# Patient Record
Sex: Male | Born: 1990 | Race: Black or African American | Hispanic: No | Marital: Single | Smoking: Never smoker
Health system: Southern US, Community
[De-identification: ages and names within clinical notes are randomized; demographics above are authoritative.]

## PROBLEM LIST (undated history)

## (undated) DIAGNOSIS — T7840XA Allergy, unspecified, initial encounter: Secondary | ICD-10-CM

## (undated) HISTORY — PX: HERNIA REPAIR: SHX51

## (undated) HISTORY — DX: Allergy, unspecified, initial encounter: T78.40XA

---
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
## 2011-02-22 ENCOUNTER — Emergency Department (HOSPITAL_COMMUNITY)
Admission: EM | Admit: 2011-02-22 | Discharge: 2011-02-22 | Disposition: A | Discharge status: Home / Self Care | Payer: BC Managed Care – PPO | Attending: Emergency Medicine | Admitting: Emergency Medicine

## 2011-02-22 ENCOUNTER — Emergency Department (HOSPITAL_COMMUNITY): Payer: BC Managed Care – PPO

## 2011-02-22 DIAGNOSIS — S301XXA Contusion of abdominal wall, initial encounter: Secondary | ICD-10-CM | POA: Insufficient documentation

## 2011-02-22 DIAGNOSIS — Y9361 Activity, american tackle football: Secondary | ICD-10-CM | POA: Insufficient documentation

## 2011-02-22 DIAGNOSIS — Y9239 Other specified sports and athletic area as the place of occurrence of the external cause: Secondary | ICD-10-CM | POA: Insufficient documentation

## 2011-02-22 DIAGNOSIS — R079 Chest pain, unspecified: Secondary | ICD-10-CM | POA: Insufficient documentation

## 2011-02-22 DIAGNOSIS — R109 Unspecified abdominal pain: Secondary | ICD-10-CM | POA: Insufficient documentation

## 2011-02-22 DIAGNOSIS — IMO0002 Reserved for concepts with insufficient information to code with codable children: Secondary | ICD-10-CM | POA: Insufficient documentation

## 2011-02-22 DIAGNOSIS — M549 Dorsalgia, unspecified: Secondary | ICD-10-CM | POA: Insufficient documentation

## 2011-02-22 DIAGNOSIS — W1801XA Striking against sports equipment with subsequent fall, initial encounter: Secondary | ICD-10-CM | POA: Insufficient documentation

## 2011-02-22 LAB — URINALYSIS, ROUTINE W REFLEX MICROSCOPIC
Bilirubin Urine: NEGATIVE
Glucose, UA: NEGATIVE mg/dL
Hgb urine dipstick: NEGATIVE
Protein, ur: NEGATIVE mg/dL
Urobilinogen, UA: 0.2 mg/dL (ref 0.0–1.0)

## 2011-02-22 MED ORDER — IOHEXOL 300 MG/ML  SOLN
100.0000 mL | Freq: Once | INTRAMUSCULAR | Status: AC | PRN
  Administered 2011-02-22: 100 mL via INTRAVENOUS

## 2011-03-25 LAB — CBC
HCT: 41.2
Hemoglobin: 14
MCV: 89.9
Platelets: 336 — ABNORMAL HIGH
WBC: 21.4 — ABNORMAL HIGH

## 2011-03-25 LAB — DIFFERENTIAL
Eosinophils Absolute: 0.1
Eosinophils Relative: 0
Lymphocytes Relative: 4 — ABNORMAL LOW
Lymphs Abs: 0.9 — ABNORMAL LOW
Monocytes Absolute: 1.8 — ABNORMAL HIGH

## 2011-03-25 LAB — MONONUCLEOSIS SCREEN: Mono Screen: POSITIVE — AB

## 2014-01-21 ENCOUNTER — Emergency Department (HOSPITAL_COMMUNITY)
Admission: EM | Admit: 2014-01-21 | Discharge: 2014-01-21 | Disposition: A | Discharge status: Home / Self Care | Payer: BC Managed Care – PPO | Attending: Emergency Medicine | Admitting: Emergency Medicine

## 2014-01-21 ENCOUNTER — Encounter (HOSPITAL_COMMUNITY): Payer: Self-pay | Admitting: Emergency Medicine

## 2014-01-21 ENCOUNTER — Ambulatory Visit: Payer: BC Managed Care – PPO

## 2014-01-21 DIAGNOSIS — H109 Unspecified conjunctivitis: Secondary | ICD-10-CM | POA: Diagnosis not present

## 2014-01-21 DIAGNOSIS — H5789 Other specified disorders of eye and adnexa: Secondary | ICD-10-CM | POA: Diagnosis present

## 2014-01-21 MED ORDER — FLUORESCEIN SODIUM 1 MG OP STRP
1.0000 | ORAL_STRIP | Freq: Once | OPHTHALMIC | Status: AC
  Administered 2014-01-21: 1 via OPHTHALMIC
  Filled 2014-01-21: qty 1

## 2014-01-21 MED ORDER — TETRACAINE HCL 0.5 % OP SOLN
1.0000 [drp] | Freq: Once | OPHTHALMIC | Status: AC
  Administered 2014-01-21: 1 [drp] via OPHTHALMIC
  Filled 2014-01-21: qty 2

## 2014-01-21 MED ORDER — POLYMYXIN B-TRIMETHOPRIM 10000-0.1 UNIT/ML-% OP SOLN: 1.0000 [drp] | OPHTHALMIC | Status: DC

## 2014-01-21 NOTE — ED Provider Notes (Signed)
Medical screening examination/treatment/procedure(s) were performed by non-physician practitioner and as supervising physician I was immediately available for consultation/collaboration.   EKG Interpretation None       Caelan Atchley, MD 01/21/14 2213 

## 2014-01-21 NOTE — ED Notes (Signed)
He c/o red, watery left eye since yesterday.  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  He is not sure if and when he may've gotten something in his eye.  He is in no distress.

## 2014-01-21 NOTE — Discharge Instructions (Signed)
Use polytrim drops as directed until symptoms resolve. Refer to attached documents for more information.  °

## 2014-01-21 NOTE — ED Provider Notes (Signed)
CSN: [id number]     Arrival date & time 01/21/14  1459 History  This chart was scribed for non-physician practitioner, Emilia Beck, PA-C, working with Doug Sou, MD, by Bronson Curb, ED Scribe. This patient was seen in room WTR6/WTR6 and the patient's care was started at 4:00 PM.    Chief Complaint  Patient presents with  . Eye Problem    Patient is a 23 y.o. male presenting with eye problem. The history is provided by the patient. No language interpreter was used.  Eye Problem Location:  L eye Duration:  1 day Timing:  Constant Progression:  Unchanged Chronicity:  New Context: not burn, not contact lens problem, not direct trauma, not using machinery and not smoke exposure   Relieved by:  None tried Worsened by:  Nothing tried Ineffective treatments:  None tried Associated symptoms: crusting, discharge and redness     HPI Comments: Michael Mark. is a 23 y.o. male who presents to the Emergency Department for a left eye problem that began yesterday. Patient suspects he may have gotten something in his eye, but is uncertain. There is associated tearing, redness, and crusting. Patient denies foreign body sensation at present. 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 Patient has no history of significant health conditions.   History reviewed. No pertinent past medical history. No past surgical history on file. No family history on file. History  Substance Use Topics  . Smoking status: Never Smoker   . Smokeless tobacco: Not on file  . Alcohol Use: No    Review of Systems  Eyes: Positive for discharge and redness.  All other systems reviewed and are negative.     Allergies  Review of patient's allergies indicates no known allergies.  Home Medications   Prior to Admission medications   Not on File   Triage Vitals: BP 141/73  Pulse 74   Temp(Src) 98.6 F (37 C) (Oral)  Resp 16  SpO2 100%  Physical Exam  Nursing note and vitals reviewed. Constitutional: He is oriented to person, place, and time. He appears well-developed and well-nourished. No distress.  HENT:  Head: Normocephalic and atraumatic.  Eyes: EOM are normal. Left eye exhibits discharge. Left conjunctiva is injected.  Left eye conjunctival injection. No abrasions noted with fluroescein dye exam. Mild watery drainage noted.  Neck: Neck supple. No tracheal deviation present.  Cardiovascular: Normal rate.   Pulmonary/Chest: Effort normal. No respiratory distress.  Musculoskeletal: Normal range of motion.  Neurological: He is alert and oriented to person, place, and time.  Skin: Skin is warm and dry.  Psychiatric: He has a normal mood and affect. His behavior is normal.    ED Course  Procedures (including critical care time)  DIAGNOSTIC STUDIES: Oxygen Saturation is 100% on room air, normal by my interpretation.    COORDINATION OF CARE: At 1610 Discussed treatment plan with patient which includes Polytrim. Patient agrees.   Labs Review Labs Reviewed - No data to display  Imaging Review No results found.   EKG Interpretation None      MDM   Final diagnoses:  Conjunctivitis of left eye    4:26 PM Patient has no corneal abrasion noted on fluorescin dye exam. Patient likely has conjunctivitis. Patient will be treated with polytrim drops.   I personally performed the services described in this documentation, which was scribed in my  presence. The recorded information has been reviewed and is accurate.    Emilia BeckKaitlyn Gage Weant, PA-C 01/21/14 1627

## 2014-02-15 ENCOUNTER — Ambulatory Visit (INDEPENDENT_AMBULATORY_CARE_PROVIDER_SITE_OTHER): Payer: BC Managed Care – PPO | Admitting: Family Medicine

## 2014-02-15 VITALS — BP 124/82 | HR 78 | Temp 98.0°F | Resp 18 | Ht 70.0 in | Wt 192.8 lb

## 2014-02-15 DIAGNOSIS — Z111 Encounter for screening for respiratory tuberculosis: Secondary | ICD-10-CM

## 2014-02-15 NOTE — Addendum Note (Signed)
Addended by: Sydell Axon on: 02/15/2014 02:06 PM   Modules accepted: Orders

## 2014-02-15 NOTE — Progress Notes (Signed)
  Tuberculosis Risk Questionnaire  1. No Were you born outside the [place] in one of the following parts of the world: [place], [place], [place], [place] or [place]?    2. No Have you traveled outside the [place] and lived for more than one month in one of the following parts of the world: [place], [place], [place], [place] or [place]?    3. No Do you have a compromised immune system such as from any of the following conditions:HIV/AIDS, organ or bone marrow transplantation, diabetes, immunosuppressive medicines (e.g. Prednisone, Remicaide), leukemia, lymphoma, cancer of the head or neck, gastrectomy or jejunal bypass, end-stage renal disease (on dialysis), or silicosis?     4. No Have you ever or do you plan on working in: a residential care center, a health care facility, a jail or prison or homeless shelter?    5. No Have you ever: injected illegal drugs, used crack cocaine, lived in a homeless shelter  or been in jail or prison?     6. No Have you ever been exposed to anyone with infectious tuberculosis?    Tuberculosis Symptom Questionnaire  Do you currently have any of the following symptoms?  1. No Unexplained cough lasting more than 3 weeks?   2. No Unexplained fever lasting more than 3 weeks.   3. No Night Sweats (sweating that leaves the bedclothes and sheets wet)     4. No Shortness of Breath   5. No Chest Pain   6. No Unintentional weight loss    7. 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 He has no ongoing problems.  Objective: HEENT: Unremarkable Chest: Clear Heart: Regular no murmur: Abdomen: Soft nontender without HSM Extremities: Full range of motion  Assessment: Healthy individual.  Plan: TB test today with followup in 48 hours to check the site.  Signed, Elvina Sidle

## 2014-02-17 ENCOUNTER — Ambulatory Visit (INDEPENDENT_AMBULATORY_CARE_PROVIDER_SITE_OTHER): Payer: BC Managed Care – PPO | Admitting: Family Medicine

## 2014-02-17 VITALS — BP 118/80 | HR 71 | Temp 97.9°F | Resp 16 | Ht 69.0 in | Wt 187.8 lb

## 2014-02-17 DIAGNOSIS — Z0000 Encounter for general adult medical examination without abnormal findings: Secondary | ICD-10-CM

## 2014-02-17 LAB — TB SKIN TEST
Induration: 0 mm
TB Skin Test: NEGATIVE

## 2014-02-17 NOTE — Progress Notes (Signed)
Patient ID: [name]. MRN: [id number], DOB: 02-10-1991 23 y.o. Date of Encounter: 02/17/2014, 2:31 PM  Primary Physician: No PCP Per Patient  Chief Complaint: Physical (CPE)  HPI: 23 y.o. y/o male with history noted below here for CPE.  Doing well. No issues/complaints. 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 He also is planning on trying out for the [employer] over the next month. 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  Has no known medical problems. He has had a hernia on the left in the past.  Review of Systems: Consitutional: No fever, chills, fatigue, night sweats, lymphadenopathy, or weight changes. Eyes: No visual changes, eye redness, or discharge. ENT/Mouth: Ears: No otalgia, tinnitus, hearing loss, discharge. Nose: No congestion, rhinorrhea, sinus pain, or epistaxis. Throat: No sore throat, post nasal drip, or teeth pain. Cardiovascular: No CP, palpitations, diaphoresis, DOE, edema, orthopnea, PND. Respiratory: No cough, hemoptysis, SOB, or wheezing. Gastrointestinal: No anorexia, dysphagia, reflux, pain, nausea, vomiting, hematemesis, diarrhea, constipation, BRBPR, or melena. Genitourinary: No dysuria, frequency, urgency, hematuria, incontinence, nocturia, decreased urinary stream, discharge, impotence, or testicular pain/masses. Musculoskeletal: No decreased ROM, myalgias, stiffness, joint swelling, or weakness. Skin: No rash, erythema, lesion changes, pain, warmth, jaundice, or pruritis. Neurological: No headache, dizziness, syncope, seizures, tremors, memory loss, coordination problems, or paresthesias. Psychological: No anxiety, depression, hallucinations, SI/HI. Endocrine: No fatigue, polydipsia, polyphagia, polyuria, or known diabetes. All other systems were reviewed and are otherwise negative.  Past Medical History  Diagnosis Date  . Allergy      Past Surgical History  Procedure Laterality Date  . Hernia repair       Home Meds:  Prior to Admission medications   Medication Sig Start Date End Date Taking? Authorizing Provider  cetirizine (ZYRTEC) 10 MG chewable tablet Chew 10 mg by mouth daily.   Yes Historical Provider, MD  trimethoprim-polymyxin b (POLYTRIM) ophthalmic solution Place 1 drop into the left eye every 4 (four) hours. 01/21/14   Emilia Beck, PA-C    Allergies: No Known Allergies  History   Social History  . Marital Status: Significant Other    Spouse Name: N/A    Number of Children: N/A  . Years of Education: N/A   Occupational History  . Not on file.   Social History Main Topics  . Smoking status: Never Smoker   . Smokeless tobacco: Not on file  . Alcohol Use: No  . Drug Use: Not on file  . Sexual Activity: Not on file   Other Topics Concern  . Not on file   Social History Narrative  . No narrative on file    Family History  Problem Relation Age of Onset  . Stroke Mother   . Cancer Mother     Physical Exam: Blood pressure 118/80, pulse 71, temperature 97.9 F (36.6 C), temperature source Oral, resp. rate 16, height  (1.753 m), weight 187 lb 12.8 oz (85.186 kg), SpO2 99.00%.  BP Readings from Last 3 Encounters:  02/17/14 118/80  02/15/14 124/82  01/21/14 126/70   General: Well developed, well nourished, in no acute distress. HEENT: Normocephalic, atraumatic. Conjunctiva pink, sclera non-icteric. Pupils 2 mm constricting to 1 mm, round, regular, and equally reactive to light and accomodation. EOMI. Internal auditory canal clear. TMs with good cone of light and without pathology. Nasal mucosa pink. Nares are without discharge. No sinus tenderness. Oral mucosa pink. Dentitionexcellent. Pharynx without exudate.    Neck: Supple. Trachea midline. No thyromegaly.  Full ROM. No lymphadenopathy. Lungs: Clear to auscultation bilaterally without wheezes, rales, or rhonchi. Breathing is of normal effort and unlabored. Cardiovascular: RRR with S1 S2. No  murmurs, rubs, or gallops appreciated. Distal pulses 2+ symmetrically. No carotid or abdominal bruits Abdomen: Soft, non-tender, non-distended with normoactive bowel sounds. No hepatosplenomegaly or masses. No rebound/guarding. No CVA tenderness. Without hernias.  Genitourinary:  circumcised male. No penile lesions. Testes descended bilaterally, and smooth without tenderness or masses.  Musculoskeletal: Full range of motion and 5/5 strength throughout. Without swelling, atrophy, tenderness, crepitus, or warmth. Extremities without clubbing, cyanosis, or edema. Calves supple. Skin: Warm and moist without erythema, ecchymosis, wounds, or rash. Neuro: A+Ox3. CN II-XII grossly intact. Moves all extremities spontaneously. Full sensation throughout. Normal gait. DTR 2+ throughout upper and lower extremities. Finger to nose intact. Psych:  Responds to questions appropriately with a normal affect.    Assessment/Plan:  23 y.o. y/o healthy male here for CPE -no abnormalities identified.  Signed, Elvina Sidle, MD 02/17/2014 2:31 PM

## 2014-02-17 NOTE — Patient Instructions (Signed)

## 2014-02-28 ENCOUNTER — Emergency Department (HOSPITAL_COMMUNITY): Payer: BC Managed Care – PPO

## 2014-02-28 ENCOUNTER — Emergency Department (HOSPITAL_COMMUNITY)
Admission: EM | Admit: 2014-02-28 | Discharge: 2014-02-28 | Disposition: A | Discharge status: Home / Self Care | Payer: BC Managed Care – PPO | Attending: Emergency Medicine | Admitting: Emergency Medicine

## 2014-02-28 ENCOUNTER — Encounter (HOSPITAL_COMMUNITY): Payer: Self-pay | Admitting: Emergency Medicine

## 2014-02-28 DIAGNOSIS — R059 Cough, unspecified: Secondary | ICD-10-CM | POA: Diagnosis present

## 2014-02-28 DIAGNOSIS — R69 Illness, unspecified: Secondary | ICD-10-CM

## 2014-02-28 DIAGNOSIS — R05 Cough: Secondary | ICD-10-CM | POA: Diagnosis present

## 2014-02-28 DIAGNOSIS — J111 Influenza due to unidentified influenza virus with other respiratory manifestations: Secondary | ICD-10-CM | POA: Diagnosis not present

## 2014-02-28 LAB — CBC WITH DIFFERENTIAL/PLATELET
Basophils Absolute: 0 10*3/uL (ref 0.0–0.1)
Basophils Relative: 0 % (ref 0–1)
EOS ABS: 0 10*3/uL (ref 0.0–0.7)
EOS PCT: 0 % (ref 0–5)
HEMATOCRIT: 42.6 % (ref 39.0–52.0)
HEMOGLOBIN: 14.7 g/dL (ref 13.0–17.0)
LYMPHS ABS: 0.9 10*3/uL (ref 0.7–4.0)
LYMPHS PCT: 5 % — AB (ref 12–46)
MCH: 31.1 pg (ref 26.0–34.0)
MCHC: 34.5 g/dL (ref 30.0–36.0)
MCV: 90.3 fL (ref 78.0–100.0)
MONO ABS: 1.9 10*3/uL — AB (ref 0.1–1.0)
MONOS PCT: 10 % (ref 3–12)
Neutro Abs: 16.5 10*3/uL — ABNORMAL HIGH (ref 1.7–7.7)
Neutrophils Relative %: 85 % — ABNORMAL HIGH (ref 43–77)
PLATELETS: 216 10*3/uL (ref 150–400)
RBC: 4.72 MIL/uL (ref 4.22–5.81)
RDW: 12.3 % (ref 11.5–15.5)
WBC: 19.3 10*3/uL — AB (ref 4.0–10.5)

## 2014-02-28 LAB — BASIC METABOLIC PANEL
Anion gap: 15 (ref 5–15)
BUN: 5 mg/dL — AB (ref 6–23)
CHLORIDE: 97 meq/L (ref 96–112)
CO2: 24 meq/L (ref 19–32)
Calcium: 9.5 mg/dL (ref 8.4–10.5)
Creatinine, Ser: 1.13 mg/dL (ref 0.50–1.35)
GFR calc Af Amer: >90 mL/min (ref >90)
GFR, EST NON AFRICAN AMERICAN: 90 mL/min — AB (ref >90)
GLUCOSE: 100 mg/dL — AB (ref 70–99)
POTASSIUM: 3.8 meq/L (ref 3.7–5.3)
Sodium: 136 mEq/L — ABNORMAL LOW (ref 137–147)

## 2014-02-28 MED ORDER — ONDANSETRON 4 MG PO TBDP
4.0000 mg | ORAL_TABLET | Freq: Once | ORAL | Status: AC
  Administered 2014-02-28: 4 mg via ORAL
  Filled 2014-02-28: qty 1

## 2014-02-28 MED ORDER — ACETAMINOPHEN 500 MG PO TABS
1000.0000 mg | ORAL_TABLET | Freq: Once | ORAL | Status: AC
  Administered 2014-02-28: 1000 mg via ORAL
  Filled 2014-02-28: qty 2

## 2014-02-28 MED ORDER — ONDANSETRON HCL 4 MG/2ML IJ SOLN
4.0000 mg | INTRAMUSCULAR | Status: AC
  Administered 2014-02-28: 4 mg via INTRAVENOUS
  Filled 2014-02-28: qty 2

## 2014-02-28 MED ORDER — ONDANSETRON HCL 4 MG PO TABS: 4.0000 mg | ORAL_TABLET | Freq: Four times a day (QID) | ORAL | Status: DC

## 2014-02-28 MED ORDER — SODIUM CHLORIDE 0.9 % IV BOLUS (SEPSIS)
1000.0000 mL | Freq: Once | INTRAVENOUS | Status: AC
  Administered 2014-02-28: 1000 mL via INTRAVENOUS

## 2014-02-28 NOTE — ED Provider Notes (Signed)
CSN: [id number]     Arrival date & time 02/28/14  1103 History   First MD Initiated Contact with Patient 02/28/14 1149     Chief Complaint  Patient presents with  . flu like symptoms      (Consider location/radiation/quality/duration/timing/severity/associated sxs/prior Treatment) HPI [name] is a 23 y.o. male with PMH of allergies presenting with two day history of fevers tmax 102.1, chills, sore throat, sinus pressure, rhinorrhea, generalized body aches worse in legs. Nonproductive cough. Patient states legs are running. Patient had two episodes of vomiting today, nonbloody nonbilious. Patient taking OTC alkalizer without relief. No abdominal pain. Patient can walk. Temp 100.8 in ED. VSS.    Past Medical History  Diagnosis Date  . Allergy    Past Surgical History  Procedure Laterality Date  . Hernia repair     Family History  Problem Relation Age of Onset  . Stroke Mother   . Cancer Mother    History  Substance Use Topics  . Smoking status: Never Smoker   . Smokeless tobacco: Not on file  . Alcohol Use: No    Review of Systems  Constitutional: Negative for fever and chills.  HENT: Positive for congestion, rhinorrhea, sinus pressure and sore throat.   Eyes: Negative for visual disturbance.  Respiratory: Positive for cough. Negative for shortness of breath.   Cardiovascular: Negative for chest pain and palpitations.  Gastrointestinal: Positive for nausea and vomiting. Negative for diarrhea.  Genitourinary: Negative for dysuria and hematuria.  Musculoskeletal: Negative for back pain and gait problem.  Skin: Negative for rash.  Neurological: Negative for weakness.      Allergies  Review of patient's allergies indicates no known allergies.  Home Medications   Prior to Admission medications   Medication Sig Start Date End Date Taking? Authorizing Provider  Aspirin Effervescent (ALKA-SELTZER PO) Take 2 tablets by mouth 4 (four) times daily as needed (cold).    Yes Historical Provider, MD  Pseudoeph-CPM-DM-APAP (PAIN RELIEF COLD PO) Take 15 mLs by mouth as needed (for cold).   Yes Historical Provider, MD  ondansetron (ZOFRAN) 4 MG tablet Take 1 tablet (4 mg total) by mouth every 6 (six) hours. 02/28/14   Benetta Spar L Laden Fieldhouse, PA-C   BP 138/68  Pulse 102  Temp(Src) 100.8 F (38.2 C) (Oral)  Resp 19  SpO2 97% Physical Exam  Nursing note and vitals reviewed. Constitutional: He appears well-developed and well-nourished. No distress.  HENT:  Head: Normocephalic and atraumatic.  Mouth/Throat: Posterior oropharyngeal edema and posterior oropharyngeal erythema present. No oropharyngeal exudate.  Eyes: Conjunctivae and EOM are normal. Right eye exhibits no discharge. Left eye exhibits no discharge.  Neck: Normal range of motion. Neck supple.  Right sided  Cardiovascular: Normal rate, regular rhythm and normal heart sounds.   Pulmonary/Chest: Effort normal and breath sounds normal. No respiratory distress. He has no wheezes.  Abdominal: Soft. Bowel sounds are normal. He exhibits no distension. There is no tenderness.  Lymphadenopathy:    He has cervical adenopathy.  Neurological: He is alert. He exhibits normal muscle tone. Coordination normal.  Skin: Skin is warm and dry. He is not diaphoretic.    ED Course  Procedures (including critical care time) Labs Review Labs Reviewed  BASIC METABOLIC PANEL - Abnormal; Notable for the following:    Sodium 136 (*)    Glucose, Bld 100 (*)    BUN 5 (*)    GFR calc non Af Amer 90 (*)    All other components within normal limits  CBC WITH DIFFERENTIAL - Abnormal; Notable for the following:    WBC 19.3 (*)    Neutrophils Relative % 85 (*)    Neutro Abs 16.5 (*)    Lymphocytes Relative 5 (*)    Monocytes Absolute 1.9 (*)    All other components within normal limits    Imaging Review Dg Chest 2 View  02/28/2014   CLINICAL DATA:  Fever.  Chills.  Sweats.  Sore throat.  Cough.  EXAM: CHEST  2 VIEW   COMPARISON:  02/22/2011  FINDINGS: The heart size and mediastinal contours are within normal limits. Both lungs are clear. The visualized skeletal structures are unremarkable.  IMPRESSION: No active cardiopulmonary disease.   Electronically Signed   By: Geanie Cooley M.D.   On: 02/28/2014 11:36     EKG Interpretation None     Meds given in ED:  Medications  sodium chloride 0.9 % bolus 1,000 mL (0 mLs Intravenous Stopped 02/28/14 1356)  ondansetron (ZOFRAN) injection 4 mg (4 mg Intravenous Given 02/28/14 1300)  acetaminophen (TYLENOL) tablet 1,000 mg (1,000 mg Oral Given 02/28/14 1355)  ondansetron (ZOFRAN-ODT) disintegrating tablet 4 mg (4 mg Oral Given 02/28/14 1356)    Discharge Medication List as of 02/28/2014  1:58 PM    START taking these medications   Details  ondansetron (ZOFRAN) 4 MG tablet Take 1 tablet (4 mg total) by mouth every 6 (six) hours., Starting 02/28/2014, Until Discontinued, Print          MDM   Final diagnoses:  Influenza-like illness   Patient with fevers, chills, sore throat, sinus congestion and general body aches. Patient with symptoms of influenza. Patient without significant PMH to warrant treatment with oseltamivir. Discussed with patient who also did not want the additional cost. Patient labs reviewed and consistent with signs of viral infection and dehydration. CXR without effusions or infiltrates. Patient given fluids, zofran, and tylenol with relief of his nausea, and sinus congestion. Patient tolerating fluids in ED. tx with symptomatic fluids, OTC cold meds and zofran for nausea. Patient is afebrile, nontoxic, and in no acute distress. Patient is appropriate for outpatient management and is stable for discharge. Patient without a PCP and encouraged to establish care. resources provided.  Discussed return precautions with patient. Discussed all results and patient verbalizes understanding and agrees with plan.      Louann Sjogren, PA-C 02/28/14  [phone]

## 2014-02-28 NOTE — ED Provider Notes (Signed)
Medical screening examination/treatment/procedure(s) were performed by non-physician practitioner and as supervising physician I was immediately available for consultation/collaboration.   EKG Interpretation None        Elwin Mocha, MD 02/28/14 1551

## 2014-02-28 NOTE — Discharge Instructions (Signed)
Return to the emergency room with worsening of symptoms, new symptoms or with symptoms that are concerning, especially persistent fevers, severe abodminal pain, unable to keep fluids down.  Drink lots of fluids with electrolytes, OTC pain medications, tylenol and NSAIDs for aches and pains, BRAT diet: bananas, rice, applesauce, toast and advance diet as tolerated. Please call your doctor for a followup appointment within 24-48 hours. When you talk to your doctor please let them know that you were seen in the emergency department and have them acquire all of your records so that they can discuss the findings with you and formulate a treatment plan to fully care for your new and ongoing problems. If you do not have a primary care provider please call the number below under ED resources to establish care with a provider and follow up.    Emergency Department Resource Guide 1) Find a Doctor and Pay Out of Pocket Although you won't have to find out who is covered by your insurance plan, it is a good idea to ask around and get recommendations. You will then need to call the office and see if the doctor you have chosen will accept you as a new patient and what types of options they offer for patients who are self-pay. Some doctors offer discounts or will set up payment plans for their patients who do not have insurance, but you will need to ask so you aren't surprised when you get to your appointment.  2) Contact Your Local Health Department Not all health departments have doctors that can see patients for sick visits, but many do, so it is worth a call to see if yours does. If you don't know where your local health department is, you can check in your phone book. The CDC also has a tool to help you locate your state's health department, and many state websites also have listings of all of their local health departments.  3) Find a Walk-in Clinic If your illness is not likely to be very severe or complicated,  you may want to try a walk in clinic. These are popping up all over the country in pharmacies, drugstores, and shopping centers. They're usually staffed by nurse practitioners or physician assistants that have been trained to treat common illnesses and complaints. They're usually fairly quick and inexpensive. However, if you have serious medical issues or chronic medical problems, these are probably not your best option.  No Primary Care Doctor: - Call Health Connect at  [phone] - they can help you locate a primary care doctor that  accepts your insurance, provides certain services, etc. - Physician Referral Service- [phone]  Chronic Pain Problems: Organization         Address  Phone   Notes  [facility]  [phone] Patients need to be referred by their primary care doctor.   Medication Assistance: Organization         Address  Phone   Notes  [facility]ication [facility] [address] [place] [place], [place] [address] [phone] --Must be a resident of [facility] -- Must have NO insurance coverage whatsoever (no Medicaid/ Medicare, etc.) -- The pt. MUST have a primary care doctor that directs their care regularly and follows them in the community   [facility]ist  [phone]   [employer]  [phone]    Agencies that provide inexpensive medical care: Retail buyer  Notes  [place]  [phone]   [facility]    [phone]   [facility] [place], [place] [address] [phone]   [place] [address], [place] [phone]   Planned Parenthood    [phone]   [facility]    [phone]   [address], [place] Phone:  [phone], Fax:  [phone] Hours of Operation:  9 am - 6 pm, M-F.  Also accepts Medicaid/Medicare and  self-pay.  [name] for [place], [address], [place] Phone: [phone], Fax: [phone]. Hours of Operation:  8:30 am - 5:30 pm, M-F.  Also accepts Medicaid and self-pay.  [facility] [place] [address], [place] Phone: [phone]   [place], [place], [place] [phone], Ext. [phone] Mondays & Thursdays: 7-9 AM.  First 15 patients are seen on a first come, first serve basis.    [place] Providers:  Organization         Address  Phone   Notes  [facility] [address], [place] [phone] Also accepts self-pay patients.  [facility] [address], [place]  [phone]   [place], [address], [place] [phone]   [facility] [address] [place] [phone]   Lucianne Lei [address], [place]   [phone] Only accepts [place] Access [place] patients after they have their name applied to their card.   Self-Pay (no insurance) in [facility]:  Organization         Address  Phone   Notes  Sickle Cell Patients, [facility] [place] [phone]   [facility] [place] [phone]   [facility] [place]  [place], [place], [place] [phone]   [facility]/Dr. Osei-Bonsu  [address] [place] or [address], [place] [phone] Phone number for both [place] and [place] locations is the same.  [facility] [address], [place] [phone]   [facility] [address] [place] or [address] [phone]   [facility] [address], [place] [phone], phone;  [phone], fax Sees patients 1st and 3rd Saturday of every month.  Must not qualify for public or private insurance (i.e. Medicaid, Medicare, [place] Health Choice, Veterans' Benefits)  Household income should be no more than 200% of the poverty level The clinic cannot treat you if you are pregnant or think you are pregnant  Sexually transmitted diseases are not treated at the clinic.    Dental Care: Organization         Address  Phone  Notes  [facility] Department of [facility] [phone] Accepts children up to age 64 who are enrolled in [place] or [place]; pregnant women with a Medicaid card; and children who have applied for Medicaid or [place] Health Choice, but were declined, whose parents can pay a reduced fee at time of service.  [facility]  Department of [facility]  [address], [place] [phone] Accepts children up to age 69 who are enrolled in [place] or [place]; pregnant women with a Medicaid card; and children who have applied for Medicaid or [place] Health Choice, but were declined, whose parents can pay a reduced fee at time of service.  [place] Adult Dental Access PROGRAM  [place] [phone] Patients are seen by appointment only. Walk-ins are not accepted. [place] will see patients 52 years of age and older. Monday - Tuesday (8am-5pm) Most Wednesdays (8:30-5pm) $30 per visit, cash only  [facility] Adult Dental Access PROGRAM  [address], [facility] [phone] Patients are seen by appointment only. Walk-ins are not accepted. [place] will see patients 56 years of age and older. One Wednesday Evening (Monthly: Volunteer Based).  $30 per visit, cash only  [place]  [phone] for adults; Children under age 70, call Graduate Pediatric Dentistry at [phone]. Children aged 16-14, please call  [phone] to request a pediatric application.  Dental services are provided in all areas of dental care including fillings, crowns and bridges, complete and partial dentures, implants, gum treatment, root canals, and extractions. Preventive care is also provided. Treatment is provided to both adults and children. Patients are selected via a lottery and there is often a waiting list.   [facility] [address], [place]  [phone] [email or web address]   [facility] [address] [place], [place] [phone], Ext. [phone] Second and Fourth Thursday of each month, opens at 6:30 AM; Clinic ends at 9 AM.  Patients are seen on a first-come first-served basis, and a limited number are seen during each clinic.   [facility]  [address] Hillard Danker [place], [place] [phone]   Eligibility Requirements You must have lived in [place], [place], or [place] counties for at least the last three months.   You cannot be eligible for state or federal sponsored [employer], including [employer], [place], or [employer].   You generally cannot be eligible for healthcare insurance through your employer.    How to apply: Eligibility screenings are held every Tuesday and Wednesday afternoon from 1:00 pm until 4:00 pm. You do not need an appointment for the interview!  [facility] [address] [place], [place]   [place]  [place] Department  [place]  [phone]    Behavioral Health Resources in the Community: Intensive Outpatient Programs Organization         Address  Phone  Notes  [facility] [place] [address] [place], [place] [phone]   [facility] [address] [place], [place]   ADS: Alcohol & Drug Svcs [address] [place], [place]   [place] [address]  [place], [place] or [phone]   Substance Abuse Resources Organization         Address  Phone  Notes  Alcohol and Drug Services  [phone]   [place]  [phone]   The [place]   Chinita Pester  [phone]   Residential & Outpatient Substance Abuse Program  [phone]   Psychological Services Organization         Address  Phone  Notes  [facility]  [phone]- [phone]   [facility]  [phone]- [phone]   [facility] [address], [place] [phone] or [phone]    Mobile Crisis Teams Organization         Address  Phone  Notes  [facility], Mobile Crisis Care Unit  [phone]   [facility]  [address] [place], [place] [phone]   Doristine LocksSharon DeEsch [address] [place] [phone]    Self-Help/Support Groups Organization         Address  Phone             Notes  [facility] [place] - variety of support groups  336- [phone] Call for more information  Narcotics Anonymous (NA), [facility] [address], [place]  2 meetings at this location   Statisticianesidential Treatment Programs Organization         Address  Phone  Notes  [facility] [address],    [place]  [phone]   [facility]  [address], [place] [phone], [place], [place] [phone]   [facility] [address], [place] [phone] Admissions: 8am-3pm M-F  [facility] [address]    [place], [place] [phone]   [facility] [address], [place], [place] [phone]   [facility] [address]  [place], [place] [phone]   [facility] - Intensive Outpatient [address], [place], [place] [phone]   [facility] ([facility]iction Recovery  [facility] [address]  [place], [place] [phone] or [phone]   [facility] ([facility]) [address] [place], [place] [phone] Accepts Medicaid  [facility] [address]  [place] [phone] Substance Abuse/Addiction Treatment   [facility] Behavioral Health Resources Organization         Address  Phone  Notes  [facility]  [phone]) [phone]   Angie FavaJulie Brannon, PhD [address] [place], [place]   [phone](336) [phone] or [phone](336) [phone]   [facility]   [address] [place], [place] [phone](336) [phone]   [facility] [address], [place], [place] [phone]) [phone] Insurance/Medicaid/sponsorship through [facility]  [facility] Families [address]                                    [place], [place] [phone]) [phone] Therapy/tele-psych/case  [facility] [address]   [place], [place] [phone]) [phone]    Dr. Lolly MustacheArfeen  [phone]   [facility]  [employer] [facility] 1) [address] [place] [address], [place] 2) [address], [place] 3)  [address], [place] [phone](336) [phone]) [phone]  [phone](336) [phone]   [facility] Child Abuse Hotline [phone]) [phone] or [phone](336) [phone] (After Hours)

## 2014-02-28 NOTE — ED Notes (Signed)
Per pt, flu like symptoms since yesterday-OTC meds not helping-fever, chills, congestion

## 2014-03-01 ENCOUNTER — Emergency Department (HOSPITAL_COMMUNITY)
Admission: EM | Admit: 2014-03-01 | Discharge: 2014-03-02 | Disposition: A | Discharge status: Home / Self Care | Payer: BC Managed Care – PPO | Attending: Emergency Medicine | Admitting: Emergency Medicine

## 2014-03-01 ENCOUNTER — Emergency Department (HOSPITAL_COMMUNITY): Payer: BC Managed Care – PPO

## 2014-03-01 ENCOUNTER — Encounter (HOSPITAL_COMMUNITY): Payer: Self-pay | Admitting: Emergency Medicine

## 2014-03-01 DIAGNOSIS — R509 Fever, unspecified: Secondary | ICD-10-CM | POA: Insufficient documentation

## 2014-03-01 DIAGNOSIS — R197 Diarrhea, unspecified: Secondary | ICD-10-CM | POA: Insufficient documentation

## 2014-03-01 DIAGNOSIS — J028 Acute pharyngitis due to other specified organisms: Secondary | ICD-10-CM

## 2014-03-01 DIAGNOSIS — Z7982 Long term (current) use of aspirin: Secondary | ICD-10-CM | POA: Insufficient documentation

## 2014-03-01 DIAGNOSIS — Z79899 Other long term (current) drug therapy: Secondary | ICD-10-CM | POA: Diagnosis not present

## 2014-03-01 DIAGNOSIS — R6889 Other general symptoms and signs: Secondary | ICD-10-CM

## 2014-03-01 DIAGNOSIS — J029 Acute pharyngitis, unspecified: Secondary | ICD-10-CM | POA: Diagnosis not present

## 2014-03-01 DIAGNOSIS — R51 Headache: Secondary | ICD-10-CM | POA: Diagnosis not present

## 2014-03-01 DIAGNOSIS — R1084 Generalized abdominal pain: Secondary | ICD-10-CM | POA: Insufficient documentation

## 2014-03-01 DIAGNOSIS — R112 Nausea with vomiting, unspecified: Secondary | ICD-10-CM | POA: Insufficient documentation

## 2014-03-01 DIAGNOSIS — B9689 Other specified bacterial agents as the cause of diseases classified elsewhere: Secondary | ICD-10-CM

## 2014-03-01 LAB — URINALYSIS, ROUTINE W REFLEX MICROSCOPIC
BILIRUBIN URINE: NEGATIVE
Glucose, UA: NEGATIVE mg/dL
HGB URINE DIPSTICK: NEGATIVE
KETONES UR: 40 mg/dL — AB
Leukocytes, UA: NEGATIVE
Nitrite: NEGATIVE
PH: 8.5 — AB (ref 5.0–8.0)
Protein, ur: 100 mg/dL — AB
SPECIFIC GRAVITY, URINE: 1.019 (ref 1.005–1.030)
Urobilinogen, UA: 1 mg/dL (ref 0.0–1.0)

## 2014-03-01 LAB — URINE MICROSCOPIC-ADD ON

## 2014-03-01 LAB — BASIC METABOLIC PANEL
Anion gap: 15 (ref 5–15)
BUN: 6 mg/dL (ref 6–23)
CALCIUM: 9.3 mg/dL (ref 8.4–10.5)
CHLORIDE: 97 meq/L (ref 96–112)
CO2: 24 mEq/L (ref 19–32)
CREATININE: 1.15 mg/dL (ref 0.50–1.35)
GFR calc non Af Amer: 88 mL/min — ABNORMAL LOW (ref >90)
Glucose, Bld: 106 mg/dL — ABNORMAL HIGH (ref 70–99)
Potassium: 3.7 mEq/L (ref 3.7–5.3)
Sodium: 136 mEq/L — ABNORMAL LOW (ref 137–147)

## 2014-03-01 LAB — CBC
HEMATOCRIT: 38.4 % — AB (ref 39.0–52.0)
Hemoglobin: 13 g/dL (ref 13.0–17.0)
MCH: 29.7 pg (ref 26.0–34.0)
MCHC: 33.9 g/dL (ref 30.0–36.0)
MCV: 87.7 fL (ref 78.0–100.0)
PLATELETS: 202 10*3/uL (ref 150–400)
RBC: 4.38 MIL/uL (ref 4.22–5.81)
RDW: 12.1 % (ref 11.5–15.5)
WBC: 14.9 10*3/uL — AB (ref 4.0–10.5)

## 2014-03-01 LAB — RAPID STREP SCREEN (MED CTR MEBANE ONLY): STREPTOCOCCUS, GROUP A SCREEN (DIRECT): NEGATIVE

## 2014-03-01 LAB — I-STAT CG4 LACTIC ACID, ED: Lactic Acid, Venous: 2.02 mmol/L (ref 0.5–2.2)

## 2014-03-01 LAB — MONONUCLEOSIS SCREEN: Mono Screen: NEGATIVE

## 2014-03-01 MED ORDER — ONDANSETRON HCL 4 MG/2ML IJ SOLN
4.0000 mg | Freq: Once | INTRAMUSCULAR | Status: AC
  Administered 2014-03-01: 4 mg via INTRAVENOUS
  Filled 2014-03-01: qty 2

## 2014-03-01 MED ORDER — SODIUM CHLORIDE 0.9 % IV BOLUS (SEPSIS)
1000.0000 mL | INTRAVENOUS | Status: AC
  Administered 2014-03-01: 1000 mL via INTRAVENOUS

## 2014-03-01 MED ORDER — HYDROCODONE-ACETAMINOPHEN 5-325 MG PO TABS
2.0000 | ORAL_TABLET | Freq: Once | ORAL | Status: AC
  Administered 2014-03-01: 2 via ORAL
  Filled 2014-03-01: qty 2

## 2014-03-01 MED ORDER — IBUPROFEN 800 MG PO TABS
800.0000 mg | ORAL_TABLET | Freq: Once | ORAL | Status: AC
  Administered 2014-03-01: 800 mg via ORAL
  Filled 2014-03-01: qty 1

## 2014-03-01 MED ORDER — MORPHINE SULFATE 2 MG/ML IJ SOLN: 2.0000 mg | Freq: Once | INTRAMUSCULAR | Status: DC

## 2014-03-01 MED ORDER — DIAZEPAM 5 MG PO TABS
5.0000 mg | ORAL_TABLET | Freq: Once | ORAL | Status: AC
  Administered 2014-03-01: 5 mg via ORAL
  Filled 2014-03-01: qty 1

## 2014-03-01 NOTE — ED Notes (Signed)
Pt states he has not felt well since Saturday  Pt states it started as a headache then progressed to headache Sunday  Monday headache and started getting a sore throat  Tuesday he started having body aches, leg pain and back pain with stomach ache  Pt was seen here and was told he had flu like symptoms and was given IVF and nausea medication  Pt states today he feels worse than he did yesterday  Pt states he is unable to get comfortable and his throat is swollen

## 2014-03-01 NOTE — ED Notes (Signed)
Pt has a urinal at the bedside.  

## 2014-03-01 NOTE — ED Provider Notes (Signed)
CSN: [id number]     Arrival date & time 03/01/14  2003 History   First MD Initiated Contact with Patient 03/01/14 2101     Chief Complaint  Patient presents with  . Headache  . Generalized Body Aches  . Sore Throat  . Fever     (Consider location/radiation/quality/duration/timing/severity/associated sxs/prior Treatment) Patient is a 23 y.o. male presenting with headaches, pharyngitis, and fever. The history is provided by the patient and medical records. No language interpreter was used.  Headache Associated symptoms: abdominal pain, diarrhea, fever, nausea and vomiting   Sore Throat Associated symptoms include abdominal pain, a fever, headaches, nausea and vomiting.  Fever Associated symptoms: diarrhea, headaches, nausea and vomiting     [name] is a 23 y.o. male  with a hx of seasonal allergies presents to the Emergency Department complaining of gradual, persistent, progressively worsening fever (T-MAX 102.8), chills, rigors, sore throat onset 4 days ago.  Pt was seen yesterday, dx with influenza like illness and returns today with increasing symptoms. Associated symptoms include abd pain, generalized but worse in the RLQ, nausea and vomiting. Pt reports myalgias of the bilateral thighs. Pt reports taking tylenol and ibuprofen without relief from the fever.  Nothing makes it better and nothing makes it worse.  Pt denies headache, neck pain, chest pain, cough, syncope, dysuria.  Pt denies recent travel or sick contacts.      Past Medical History  Diagnosis Date  . Allergy    Past Surgical History  Procedure Laterality Date  . Hernia repair     Family History  Problem Relation Age of Onset  . Stroke Mother   . Cancer Mother    History  Substance Use Topics  . Smoking status: Never Smoker   . Smokeless tobacco: Not on file  . Alcohol Use: No    Review of Systems  Constitutional: Positive for fever.  Gastrointestinal: Positive for nausea, vomiting, abdominal pain and  diarrhea.  Neurological: Positive for headaches.      Allergies  Review of patient's allergies indicates no known allergies.  Home Medications   Prior to Admission medications   Medication Sig Start Date End Date Taking? Authorizing Provider  acetaminophen (TYLENOL) 500 MG tablet Take 500 mg by mouth every 6 (six) hours as needed for mild pain.   Yes Historical Provider, MD  Aspirin Effervescent (ALKA-SELTZER PO) Take 2 tablets by mouth 4 (four) times daily as needed (cold).   Yes Historical Provider, MD  ibuprofen (ADVIL,MOTRIN) 200 MG tablet Take 200 mg by mouth every 6 (six) hours as needed for moderate pain.   Yes Historical Provider, MD  phenol (CHLORASEPTIC) 1.4 % LIQD Use as directed 1 spray in the mouth or throat as needed for throat irritation / pain.   Yes Historical Provider, MD  Pseudoeph-CPM-DM-APAP (PAIN RELIEF COLD PO) Take 15 mLs by mouth as needed (for cold).   Yes Historical Provider, MD  ondansetron (ZOFRAN) 4 MG tablet Take 1 tablet (4 mg total) by mouth every 6 (six) hours. 02/28/14   Louann Sjogren, PA-C   BP 121/69  Pulse 89  Temp(Src) 100.4 F (38 C) (Oral)  Resp 16  Ht  (1.753 m)  Wt 186 lb (84.369 kg)  BMI 27.45 kg/m2  SpO2 98% Physical Exam  Nursing note and vitals reviewed. Constitutional: He is oriented to person, place, and time. He appears well-developed and well-nourished. No distress.  Awake, alert, nontoxic appearance  HENT:  Head: Normocephalic and atraumatic.  Right Ear: Tympanic  membrane, external ear and ear canal normal.  Left Ear: Tympanic membrane, external ear and ear canal normal.  Nose: Mucosal edema and rhinorrhea present. No epistaxis. Right sinus exhibits no maxillary sinus tenderness and no frontal sinus tenderness. Left sinus exhibits no maxillary sinus tenderness and no frontal sinus tenderness.  Mouth/Throat: Uvula is midline. Mucous membranes are not pale, dry and not cyanotic. No trismus in the jaw. No uvula swelling.  Oropharyngeal exudate, posterior oropharyngeal edema and posterior oropharyngeal erythema present. No tonsillar abscesses.  Mucous membranes Oropharyngeal exudate, erythema and edema  Eyes: Conjunctivae are normal. Pupils are equal, round, and reactive to light. No scleral icterus.  Neck: Normal range of motion, full passive range of motion without pain and phonation normal. Neck supple. No tracheal tenderness, no spinous process tenderness and no muscular tenderness present. No rigidity. No erythema and normal range of motion present. No Brudzinski's sign and no Kernig's sign noted.  Range of motion without pain no No midline or paraspinal tenderness Normal phonation No stridor Handling secretions without difficulty No nuchal rigidity or meningeal signs  Cardiovascular: Normal rate, regular rhythm, normal heart sounds and intact distal pulses.   Pulses:      Radial pulses are 2+ on the right side, and 2+ on the left side.  Pulmonary/Chest: Effort normal and breath sounds normal. No stridor. No respiratory distress. He has no decreased breath sounds. He has no wheezes.  Equal chest expansion  Abdominal: Soft. Normal appearance and bowel sounds are normal. He exhibits no distension and no mass. There is tenderness. There is guarding. There is no rebound and no CVA tenderness.  General abdominal tenderness, slightly worse and her Cordran without rebound or peritoneal signs; mild voluntary guarding throughout No CVA tenderness  Musculoskeletal: Normal range of motion. He exhibits no edema.  Lymphadenopathy:       Head (right side): Submandibular and tonsillar adenopathy present. No submental, no preauricular, no posterior auricular and no occipital adenopathy present.       Head (left side): Submandibular and tonsillar adenopathy present. No submental, no preauricular, no posterior auricular and no occipital adenopathy present.    He has no cervical adenopathy.       Right cervical: No  superficial cervical, no deep cervical and no posterior cervical adenopathy present.      Left cervical: No superficial cervical, no deep cervical and no posterior cervical adenopathy present.  Neurological: He is alert and oriented to person, place, and time.  Speech is clear and goal oriented Moves extremities without ataxia  Skin: Skin is warm and dry. No rash noted. He is not diaphoretic.  Hot and dry to touch No rash, petechiae or purpura  Psychiatric: He has a normal mood and affect.    ED Course  Procedures (including critical care time) Labs Review Labs Reviewed  CBC - Abnormal; Notable for the following:    WBC 14.9 (*)    HCT 38.4 (*)    All other components within normal limits  BASIC METABOLIC PANEL - Abnormal; Notable for the following:    Sodium 136 (*)    Glucose, Bld 106 (*)    GFR calc non Af Amer 88 (*)    All other components within normal limits  URINALYSIS, ROUTINE W REFLEX MICROSCOPIC - Abnormal; Notable for the following:    pH 8.5 (*)    Ketones, ur 40 (*)    Protein, ur 100 (*)    All other components within normal limits  RAPID STREP SCREEN  CULTURE, GROUP A STREP  MONONUCLEOSIS SCREEN  URINE MICROSCOPIC-ADD ON  INFLUENZA PANEL BY PCR (TYPE A & B, H1N1)  I-STAT CG4 LACTIC ACID, ED    Imaging Review Dg Chest 2 View  03/01/2014   CLINICAL DATA:  Fever.  EXAM: CHEST  2 VIEW  COMPARISON:  Chest x-ray from yesterday  FINDINGS: Normal heart size and mediastinal contours. No acute infiltrate or edema. No effusion or pneumothorax. No acute osseous findings.  IMPRESSION: Negative for pneumonia.   Electronically Signed   By: Tiburcio Pea M.D.   On: 03/01/2014 22:17   Dg Chest 2 View  02/28/2014   CLINICAL DATA:  Fever.  Chills.  Sweats.  Sore throat.  Cough.  EXAM: CHEST  2 VIEW  COMPARISON:  02/22/2011  FINDINGS: The heart size and mediastinal contours are within normal limits. Both lungs are clear. The visualized skeletal structures are unremarkable.   IMPRESSION: No active cardiopulmonary disease.   Electronically Signed   By: Geanie Cooley M.D.   On: 02/28/2014 11:36     EKG Interpretation None      MDM   Final diagnoses:  Fever, unspecified fever cause  Rigors  Non-intractable vomiting with nausea, vomiting of unspecified type  Generalized abdominal pain  Bacterial pharyngitis   [name] presents with persistent fevers, Reiter's, tachycardia, abdominal pain, nausea and vomiting.  Patient complaining of significant myalgias. Will give Vicodin.  Patient reports symptoms have been ongoing for 48 hours at this time.  Patient reports feeling some better after fluids and IV nausea medications yesterday but symptoms return and he feels significantly worse. Patient's fever is higher at this time and was yesterday.  Evidence of bacterial pharyngitis on exam; perhaps with the patient's fever. We'll check for strep, mono, pneumonia, UTI. Patient with leukocytosis of 19 yesterday, will repeat CBC.  12:30AM Patient with improvement in symptoms, tolerating by mouth at this time. He reports continued difficulty sleeping and feeling as if his muscles are cramping. Will give Valium.  Patient with decreasing leukocytosis. Her repeat exam his abdomen is soft but remains generally tender, worse in the right lower quadrant. Will obtain CT scan.  1:01 AM No source of infection found; influenza panel pending. CT scan pending.  1:28 AM Pt discussed with Tatyana, PA-C who will follow imaging and dispo accordinly.    Plan: If CT scan is negative, patient to be discharged home with symptomatic treatment and treated for bacterial pharyngitis.  Dahlia Client Holbert Caples, PA-C 03/02/14 0130

## 2014-03-02 ENCOUNTER — Encounter (HOSPITAL_COMMUNITY): Payer: Self-pay

## 2014-03-02 ENCOUNTER — Emergency Department (HOSPITAL_COMMUNITY): Payer: BC Managed Care – PPO

## 2014-03-02 LAB — INFLUENZA PANEL BY PCR (TYPE A & B)
H1N1 flu by pcr: NOT DETECTED
INFLBPCR: NEGATIVE
Influenza A By PCR: NEGATIVE

## 2014-03-02 MED ORDER — IBUPROFEN 800 MG PO TABS
800.0000 mg | ORAL_TABLET | Freq: Three times a day (TID) | ORAL | Status: DC
Start: 2014-03-02 — End: 2015-05-11

## 2014-03-02 MED ORDER — HYDROCODONE-ACETAMINOPHEN 5-325 MG PO TABS: 1.0000 | ORAL_TABLET | ORAL | Status: DC | PRN

## 2014-03-02 MED ORDER — AMOXICILLIN 500 MG PO CAPS: 500.0000 mg | ORAL_CAPSULE | Freq: Three times a day (TID) | ORAL | Status: DC

## 2014-03-02 MED ORDER — IOHEXOL 300 MG/ML  SOLN
100.0000 mL | Freq: Once | INTRAMUSCULAR | Status: AC | PRN
  Administered 2014-03-02: 100 mL via INTRAVENOUS

## 2014-03-02 MED ORDER — IOHEXOL 300 MG/ML  SOLN
50.0000 mL | Freq: Once | INTRAMUSCULAR | Status: AC | PRN
  Administered 2014-03-02: 50 mL via ORAL

## 2014-03-02 NOTE — Discharge Instructions (Signed)
Ibuprofen and norco for pain. Salt water throat gargles. Rest. Make sure to drink plenty of fluids. Take amoxicillin as prescribed until all gone for pharyngitis. Follow up with your doctor for recheck. Return if symptoms or abdominal pain is worsening.   Pharyngitis Pharyngitis is redness, pain, and swelling (inflammation) of your pharynx.  CAUSES  Pharyngitis is usually caused by infection. Most of the time, these infections are from viruses (viral) and are part of a cold. However, sometimes pharyngitis is caused by bacteria (bacterial). Pharyngitis can also be caused by allergies. Viral pharyngitis may be spread from person to person by coughing, sneezing, and personal items or utensils (cups, forks, spoons, toothbrushes). Bacterial pharyngitis may be spread from person to person by more intimate contact, such as kissing.  SIGNS AND SYMPTOMS  Symptoms of pharyngitis include:   Sore throat.   Tiredness (fatigue).   Low-grade fever.   Headache.  Joint pain and muscle aches.  Skin rashes.  Swollen lymph nodes.  Plaque-like film on throat or tonsils (often seen with bacterial pharyngitis). DIAGNOSIS  Your health care provider will ask you questions about your illness and your symptoms. Your medical history, along with a physical exam, is often all that is needed to diagnose pharyngitis. Sometimes, a rapid strep test is done. Other lab tests may also be done, depending on the suspected cause.  TREATMENT  Viral pharyngitis will usually get better in 3-4 days without the use of medicine. Bacterial pharyngitis is treated with medicines that kill germs (antibiotics).  HOME CARE INSTRUCTIONS   Drink enough water and fluids to keep your urine clear or pale yellow.   Only take over-the-counter or prescription medicines as directed by your health care provider:   If you are prescribed antibiotics, make sure you finish them even if you start to feel better.   Do not take aspirin.    Get lots of rest.   Gargle with 8 oz of salt water ( tsp of salt per 1 qt of water) as often as every 1-2 hours to soothe your throat.   Throat lozenges (if you are not at risk for choking) or sprays may be used to soothe your throat. SEEK MEDICAL CARE IF:   You have large, tender lumps in your neck.  You have a rash.  You cough up green, yellow-brown, or bloody spit. SEEK IMMEDIATE MEDICAL CARE IF:   Your neck becomes stiff.  You drool or are unable to swallow liquids.  You vomit or are unable to keep medicines or liquids down.  You have severe pain that does not go away with the use of recommended medicines.  You have trouble breathing (not caused by a stuffy nose). MAKE SURE YOU:   Understand these instructions.  Will watch your condition.  Will get help right away if you are not doing well or get worse. Document Released: 05/26/2005 Document Revised: 03/16/2013 Document Reviewed: 01/31/2013 [facility] Patient Information 2015 [place], [place]. This information is not intended to replace advice given to you by your health care provider. Make sure you discuss any questions you have with your health care provider.

## 2014-03-02 NOTE — ED Provider Notes (Signed)
Medical screening examination/treatment/procedure(s) were performed by non-physician practitioner and as supervising physician I was immediately available for consultation/collaboration.   EKG Interpretation None        Pawel Soules, MD 03/02/14 0717 

## 2014-03-02 NOTE — ED Provider Notes (Signed)
Patient signed out to me at shift change. Patient with sore throat, congestion, nausea, vomiting, abdominal and back pain, fever for 4 days. Was seen for the same yesterday given IV fluids and nausea medication with improvement. States today his symptoms are worsening and he is now having temperature 103. Patient had lab work done which showed elevated white blood cell count, otherwise unremarkable. His strep and mono test her back negative. Given his elevated white count, fever, and abdominal pain, CT of the abdomen and pelvis was obtained. Patient signed out to me pending CT.   3:26 AM CT of abdomen and pelvis is negative, however appendix was not visualized. There were no secondary findings of acute appendicitis based on radiologist's report. I reassess patient. At this time no abdominal tenderness. Patient states he seems to think that abdominal pain was due to to his vomiting. He states his primary concern is his fever and his soap or. I discussed with Dr. Romeo Apple, who agrees that at this time okay to discharge with strict precautions to return if abdominal pain is worsening. Will start on antibiotics for bacterial pharyngitis, pain medications, and followup with primary care Dr. Davina Poke to return if his symptoms are worsening. Patient agrees to the plan and voiced understanding.  Filed Vitals:   03/01/14 2047 03/01/14 2327 03/01/14 2335 03/02/14 0321  BP: 132/100 121/69  120/73  Pulse: 110 89  72  Temp: 102.8 F (39.3 C)  100.4 F (38 C) 98.7 F (37.1 C)  TempSrc: Oral  Oral Oral  Resp: Height:  (1.753 m)     Weight: 186 lb (84.369 kg)     SpO2: 100% 98%  100%     Lottie Mussel, PA-C 03/02/14 [phone]

## 2014-03-05 LAB — CULTURE, GROUP A STREP

## 2014-03-07 NOTE — ED Provider Notes (Signed)
Medical screening examination/treatment/procedure(s) were performed by non-physician practitioner and as supervising physician I was immediately available for consultation/collaboration.   EKG Interpretation None        Audree CamelScott T Jayma Volpi, MD 03/07/14 2315

## 2015-05-11 ENCOUNTER — Ambulatory Visit (INDEPENDENT_AMBULATORY_CARE_PROVIDER_SITE_OTHER): Payer: BC Managed Care – PPO | Admitting: Physician Assistant

## 2015-05-11 VITALS — BP 112/80 | HR 78 | Temp 98.3°F | Resp 16 | Ht 69.0 in | Wt 185.0 lb

## 2015-05-11 DIAGNOSIS — R6884 Jaw pain: Secondary | ICD-10-CM

## 2015-05-11 MED ORDER — DICLOFENAC SODIUM 75 MG PO TBEC: 75.0000 mg | DELAYED_RELEASE_TABLET | Freq: Two times a day (BID) | ORAL | Status: DC

## 2015-05-11 NOTE — Progress Notes (Signed)
   [name]  MRN: [id number] DOB: 10/31/1990  Subjective:  Pt presents to clinic  There are no active problems to display for this patient.   Current Outpatient Prescriptions on File Prior to Visit  Medication Sig Dispense Refill  . acetaminophen (TYLENOL) 500 MG tablet Take 500 mg by mouth every 6 (six) hours as needed for mild pain.    Marland Kitchen. ibuprofen (ADVIL,MOTRIN) 200 MG tablet Take 200 mg by mouth every 6 (six) hours as needed for moderate pain.    Marland Kitchen. ibuprofen (ADVIL,MOTRIN) 800 MG tablet Take 1 tablet (800 mg total) by mouth 3 (three) times daily. (Patient not taking: Reported on 05/11/2015) 21 tablet 0   No current facility-administered medications on file prior to visit.    Allergies  Allergen Reactions  . Morphine And Related Nausea Only    Review of Systems Objective:  BP 112/80 mmHg  Pulse 78  Temp(Src) 98.3 F (36.8 C)  Resp 16  Ht 5\' 9"  (1.753 m)  Wt 185 lb (83.915 kg)  BMI 27.31 kg/m2  SpO2 97%  Physical Exam  Assessment and Plan :  No diagnosis found.  Benny LennertSarah Jameshia Hayashida PA-C  [facility] 05/11/2015 5:01 PM

## 2015-05-11 NOTE — Progress Notes (Signed)
   [name]  MRN: [id number] DOB: 01/23/1991  Subjective:  Pt presents to clinic  There are no active problems to display for this patient.   Current Outpatient Prescriptions on File Prior to Visit  Medication Sig Dispense Refill  . acetaminophen (TYLENOL) 500 MG tablet Take 500 mg by mouth every 6 (six) hours as needed for mild pain.    . ibuprofen (ADVIL,MOTRIN) 200 MG tablet Take 200 mg by mouth every 6 (six) hours as needed for moderate pain.    . ibuprofen (ADVIL,MOTRIN) 800 MG tablet Take 1 tablet (800 mg total) by mouth 3 (three) times daily. (Patient not taking: Reported on 05/11/2015) 21 tablet 0   No current facility-administered medications on file prior to visit.    Allergies  Allergen Reactions  . Morphine And Related Nausea Only    Review of Systems Objective:  BP 112/80 mmHg  Pulse 78  Temp(Src) 98.3 F (36.8 C)  Resp 16  Ht 5' 9" (1.753 m)  Wt 185 lb (83.915 kg)  BMI 27.31 kg/m2  SpO2 97%  Physical Exam  Assessment and Plan :  No diagnosis found.  Sarah Weber PA-C  [facility] 05/11/2015 5:01 PM  

## 2015-05-11 NOTE — Progress Notes (Signed)
   [name]  MRN: [id number] DOB: 10/31/1990  Subjective:  Pt presents to clinic with right sided jaw pain after he ran into a team mate while playing flag football last pm with his jaw.  Teeth on that side hurt when he clenches down.  He hit another player with the right said of his jaw on the other players back - he was slightly dizzy after the hit but he had no LOC and he continued to play the game.  He does have a mild HA today.  He does not remember his jaw "being out of place" after the injury.  Today he noticed the when he opens his jaw fully he has a popping sensation and pain.  He having a hard time eating because of opening his mouth wide and the teeth pain - he only has teeth pain with biting down.  He bite the inside of his front lip.  Home treatment - some ice  There are no active problems to display for this patient.   No current outpatient prescriptions on file prior to visit.   No current facility-administered medications on file prior to visit.    Allergies  Allergen Reactions  . Morphine And Related Nausea Only    Review of Systems  HENT: Positive for dental problem.   Neurological: Positive for headaches.   Objective:  BP 112/80 mmHg  Pulse 78  Temp(Src) 98.3 F (36.8 C)  Resp 16  Ht 5\' 9"  (1.753 m)  Wt 185 lb (83.915 kg)  BMI 27.31 kg/m2  SpO2 97%  Physical Exam  Constitutional: He is oriented to person, place, and time and well-developed, well-nourished, and in no distress.     HENT:  Head: Normocephalic and atraumatic.  Right Ear: Hearing, tympanic membrane, external ear and ear canal normal.  Left Ear: Hearing, tympanic membrane, external ear and ear canal normal.  Nose: Nose normal.  Mouth/Throat: Uvula is midline, oropharynx is clear and moist and mucous membranes are normal. Normal dentition. No dental abscesses, lacerations or dental caries.    TMJ feels aligned - left side feels unremarkable with full extension but the right side feels  like the mandible slips laterally only at the last 25% of extension of the joint - there is minimal TTP over the TMJ Teeth are intact  Eyes: Conjunctivae are normal.  Neck: Normal range of motion.  Cardiovascular: Normal rate, regular rhythm and normal heart sounds.   Pulmonary/Chest: Effort normal and breath sounds normal. He has no wheezes.  Lymphadenopathy:       Head (right side): No tonsillar adenopathy present.       Head (left side): No tonsillar adenopathy present.    He has no cervical adenopathy.       Right: No supraclavicular adenopathy present.       Left: No supraclavicular adenopathy present.  Neurological: He is alert and oriented to person, place, and time. Gait normal.  Skin: Skin is warm and dry.  Psychiatric: Mood, memory, affect and judgment normal.    Assessment and Plan :  Jaw pain - Plan: diclofenac (VOLTAREN) 75 MG EC tablet  Rest and soft foods over the weeks - ? The TM joint subluxed yesterday but it is definitely no dislocated.  He will call the dentist on Monday am if he is still having problems.    Benny LennertSarah Weber PA-C  [facility] 05/11/2015 5:28 PM

## 2015-05-11 NOTE — Patient Instructions (Signed)
Rest and ice Soft foods and foods that does not cause you to open your mouth really wide

## 2016-08-17 ENCOUNTER — Encounter (HOSPITAL_COMMUNITY): Payer: Self-pay | Admitting: Emergency Medicine

## 2016-08-17 ENCOUNTER — Emergency Department (HOSPITAL_COMMUNITY)
Admission: EM | Admit: 2016-08-17 | Discharge: 2016-08-17 | Disposition: A | Discharge status: Home / Self Care | Payer: BC Managed Care – PPO | Attending: Dermatology | Admitting: Dermatology

## 2016-08-17 DIAGNOSIS — Y999 Unspecified external cause status: Secondary | ICD-10-CM | POA: Insufficient documentation

## 2016-08-17 DIAGNOSIS — Y9367 Activity, basketball: Secondary | ICD-10-CM | POA: Insufficient documentation

## 2016-08-17 DIAGNOSIS — Y929 Unspecified place or not applicable: Secondary | ICD-10-CM | POA: Insufficient documentation

## 2016-08-17 DIAGNOSIS — X58XXXA Exposure to other specified factors, initial encounter: Secondary | ICD-10-CM | POA: Insufficient documentation

## 2016-08-17 DIAGNOSIS — S99912A Unspecified injury of left ankle, initial encounter: Secondary | ICD-10-CM | POA: Insufficient documentation

## 2016-08-17 NOTE — ED Notes (Signed)
Called back for fast track room x1, no answer, patient not seen in lobby

## 2016-08-17 NOTE — ED Notes (Signed)
Patient called with no response.

## 2016-08-17 NOTE — ED Triage Notes (Signed)
Pt had basketball injury to left ankle on 18th of last month had an xray and nothing broken but pain when running. Pt has been icing with no relief. Pt ambulatory.

## 2016-09-17 ENCOUNTER — Emergency Department (HOSPITAL_COMMUNITY)
Admission: EM | Admit: 2016-09-17 | Discharge: 2016-09-18 | Disposition: A | Discharge status: Home / Self Care | Payer: BC Managed Care – PPO | Attending: Emergency Medicine | Admitting: Emergency Medicine

## 2016-09-17 ENCOUNTER — Encounter (HOSPITAL_COMMUNITY): Payer: Self-pay | Admitting: Emergency Medicine

## 2016-09-17 DIAGNOSIS — R112 Nausea with vomiting, unspecified: Secondary | ICD-10-CM | POA: Insufficient documentation

## 2016-09-17 LAB — CBC
HCT: 45.7 % (ref 39.0–52.0)
Hemoglobin: 15.8 g/dL (ref 13.0–17.0)
MCH: 30.9 pg (ref 26.0–34.0)
MCHC: 34.6 g/dL (ref 30.0–36.0)
MCV: 89.4 fL (ref 78.0–100.0)
Platelets: 272 10*3/uL (ref 150–400)
RBC: 5.11 MIL/uL (ref 4.22–5.81)
RDW: 12.5 % (ref 11.5–15.5)
WBC: 15.2 10*3/uL — ABNORMAL HIGH (ref 4.0–10.5)

## 2016-09-17 LAB — URINALYSIS, ROUTINE W REFLEX MICROSCOPIC
BILIRUBIN URINE: NEGATIVE
Bacteria, UA: NONE SEEN
Glucose, UA: NEGATIVE mg/dL
HGB URINE DIPSTICK: NEGATIVE
KETONES UR: NEGATIVE mg/dL
Leukocytes, UA: NEGATIVE
Nitrite: NEGATIVE
Protein, ur: 100 mg/dL — AB
SQUAMOUS EPITHELIAL / LPF: NONE SEEN
Specific Gravity, Urine: 1.025 (ref 1.005–1.030)
pH: 9 — ABNORMAL HIGH (ref 5.0–8.0)

## 2016-09-17 MED ORDER — ONDANSETRON 4 MG PO TBDP
4.0000 mg | ORAL_TABLET | Freq: Once | ORAL | Status: AC | PRN
  Administered 2016-09-17: 4 mg via ORAL
  Filled 2016-09-17: qty 1

## 2016-09-17 NOTE — ED Notes (Signed)
Pt actively vomiting large amount in triage.

## 2016-09-17 NOTE — ED Triage Notes (Signed)
Pt presents with complaints of dizziness since this morning.  Ate [name] for lunch during his break and has thrown up twice since.  Pt states he has been getting hot and cold quickly but is afebrile on assessment. Pt ambulatory and A&O x4.

## 2016-09-18 LAB — COMPREHENSIVE METABOLIC PANEL
ALBUMIN: 5.5 g/dL — AB (ref 3.5–5.0)
ALT: 28 U/L (ref 17–63)
AST: 36 U/L (ref 15–41)
Alkaline Phosphatase: 53 U/L (ref 38–126)
Anion gap: 9 (ref 5–15)
BILIRUBIN TOTAL: 2.2 mg/dL — AB (ref 0.3–1.2)
BUN: 16 mg/dL (ref 6–20)
CO2: 27 mmol/L (ref 22–32)
Calcium: 9.7 mg/dL (ref 8.9–10.3)
Chloride: 105 mmol/L (ref 101–111)
Creatinine, Ser: 1.21 mg/dL (ref 0.61–1.24)
GFR calc Af Amer: >60 mL/min (ref >60)
GFR calc non Af Amer: >60 mL/min (ref >60)
GLUCOSE: 128 mg/dL — AB (ref 65–99)
POTASSIUM: 3.5 mmol/L (ref 3.5–5.1)
Sodium: 141 mmol/L (ref 135–145)
Total Protein: 8.5 g/dL — ABNORMAL HIGH (ref 6.5–8.1)

## 2016-09-18 LAB — LIPASE, BLOOD: Lipase: 23 U/L (ref 11–51)

## 2016-09-18 MED ORDER — ONDANSETRON 4 MG PO TBDP: 4.0000 mg | ORAL_TABLET | Freq: Three times a day (TID) | ORAL | 0 refills | Status: DC | PRN

## 2016-09-18 MED ORDER — ONDANSETRON HCL 4 MG/2ML IJ SOLN
4.0000 mg | Freq: Once | INTRAMUSCULAR | Status: AC
  Administered 2016-09-18: 4 mg via INTRAVENOUS
  Filled 2016-09-18: qty 2

## 2016-09-18 MED ORDER — SODIUM CHLORIDE 0.9 % IV SOLN
Freq: Once | INTRAVENOUS | Status: AC
  Administered 2016-09-18: 01:00:00 via INTRAVENOUS

## 2016-09-18 MED ORDER — SODIUM CHLORIDE 0.9 % IV BOLUS (SEPSIS)
1000.0000 mL | Freq: Once | INTRAVENOUS | Status: AC
  Administered 2016-09-18: 1000 mL via INTRAVENOUS

## 2016-09-18 NOTE — ED Provider Notes (Signed)
[name], [name], [name], Michael that this documentation has been prepared under the direction and in the presence of [name], [name], [name]  Patient presents with  . Vomiting    The history is provided by the patient. No language interpreter was used.     HPI Comments: EMET RAFANAN is a 26 y.o. male who presents to the Emergency Department complaining of persistent nausea beginning yesterday afternoon. He states he ate [employer] yesterday afternoon and experienced nausea and dizziness shortly thereafter. Pt states he has had three episodes of vomiting since onset of his nausea. He endorses some epigastric pain prior to vomiting. He has taken OTC medications with no significant improvement of his symptoms. Denies heavy NSAID use. Patient has some family contacts that have had similar symptoms in the last few days. No other known sick contacts. Pt has a PSHx of abdominal hernia repair but no other abdominal surgeries. No heavy alcohol use. He denies fevers, diarrhea (but had some 'soft stool' without blood), constipation, or any other associated symptoms.  Past Medical History:  Diagnosis Date  . Allergy     There are no active problems to display for this patient.   Past Surgical History:  Procedure Laterality Date  . HERNIA REPAIR         Home Medications    Prior to Admission medications   Medication Sig Start Date End Date Taking? Authorizing Provider  acetaminophen (TYLENOL) 500 MG tablet Take 1,000 mg by mouth every 6 (six) hours as needed for mild pain.   Yes Historical Provider, MD  cetirizine (ZYRTEC) 10 MG chewable tablet Chew 10 mg by mouth daily.   Yes Historical Provider, MD  ibuprofen (ADVIL,MOTRIN) 200 MG tablet Take 400 mg by mouth every 6 (six)  hours as needed for moderate pain.   Yes Historical Provider, MD  diclofenac (VOLTAREN) 75 MG EC tablet Take 1 tablet (75 mg total) by mouth 2 (two) times daily. Patient not taking: Reported on 09/17/2016 05/11/15   Morrell Riddle, PA-C    Family History Family History  Problem Relation Age of Onset  . Stroke Mother   . Cancer Mother     Social History Social History  Substance Use Topics  . Smoking status: Never Smoker  . Smokeless tobacco: Never Used  . Alcohol use Yes     Comment: socially     Allergies   Morphine and related   Review of Systems Review of Systems  Constitutional: Negative for fever.  HENT: Negative for rhinorrhea and sore throat.   Eyes: Negative for redness.  Respiratory: Negative for cough.   Cardiovascular: Negative for chest pain.  Gastrointestinal: Positive for abdominal pain, nausea and vomiting. Negative for blood in stool, constipation and diarrhea.  Genitourinary: Negative for dysuria.  Musculoskeletal: Negative for myalgias.  Skin: Negative for rash.  Neurological: Positive for dizziness. Negative for headaches.   Physical Exam Updated Vital Signs BP 137/77 (BP Location: Right Arm)   Pulse 96   Temp 98.4 F (36.9 C) (Oral)   Resp 19   Ht  (1.778 m)   Wt 195 lb (88.5 kg)   SpO2 100%   BMI 27.98 kg/m   Physical Exam  Constitutional: He is oriented to person, place, and time. He appears well-developed and well-nourished. No  distress.  HENT:  Head: Normocephalic and atraumatic.  Mouth/Throat: Oropharynx is clear and moist.  Eyes: Conjunctivae and EOM are normal. Right eye exhibits no discharge. Left eye exhibits no discharge.  Neck: Normal range of motion. Neck supple. No tracheal deviation present.  Cardiovascular: Normal rate, regular rhythm and normal heart sounds.   Pulmonary/Chest: Effort normal and breath sounds normal. No respiratory distress.  Abdominal: Soft. There is tenderness (Minimal, epigastric). There is no  rebound and no guarding.  Musculoskeletal: Normal range of motion.  Neurological: He is alert and oriented to person, place, and time.  Skin: Skin is warm and dry.  Psychiatric: He has a normal mood and affect. His behavior is normal.  Nursing note and vitals reviewed.  ED Treatments / Results  Labs (all labs ordered are listed, but only abnormal results are displayed) Labs Reviewed  COMPREHENSIVE METABOLIC PANEL - Abnormal; Notable for the following:       Result Value   Glucose, Bld 128 (*)    Total Protein 8.5 (*)    Albumin 5.5 (*)    Total Bilirubin 2.2 (*)    All other components within normal limits  CBC - Abnormal; Notable for the following:    WBC 15.2 (*)    All other components within normal limits  URINALYSIS, ROUTINE W REFLEX MICROSCOPIC - Abnormal; Notable for the following:    pH 9.0 (*)    Protein, ur 100 (*)    All other components within normal limits  LIPASE, BLOOD    EKG  EKG Interpretation None       Radiology No results found.  Procedures Procedures (including critical care time)  DIAGNOSTIC STUDIES: Oxygen Saturation is 100% on RA, normal by my interpretation.    COORDINATION OF CARE: 12:55 AM Discussed treatment plan with pt at bedside and pt agreed to plan.  Medications Ordered in ED Medications  sodium chloride 0.9 % bolus 1,000 mL (not administered)  ondansetron (ZOFRAN-ODT) disintegrating tablet 4 mg (4 mg Oral Given 09/17/16 2324)  ondansetron (ZOFRAN) injection 4 mg (4 mg Intravenous Given 09/18/16 0035)  0.9 %  sodium chloride infusion ( Intravenous New Bag/Given 09/18/16 0040)     Initial Impression / Assessment and Plan / ED Course  [name] have reviewed the triage vital signs and the nursing notes.  Pertinent labs & imaging results that were available during my care of the patient were reviewed by me and considered in my medical decision making (see chart for details).     Vital signs reviewed and are as follows: Vitals:    09/17/16 2318 09/18/16 0124  BP: 137/77 (!) 107/54  Pulse: 96 89  Resp: 19 18  Temp: 98.4 F (36.9 C)    Patient treated in emergency department with IV Zofran and 1 L of IV normal saline. He has passed oral fluid challenge. On repeat abdominal exam, no tenderness exhibited.  We discussed typical course of viral gastroenteritis and need to return if symptoms do not follow this course.  The patient was urged to return to the Emergency Department immediately with worsening of current symptoms, worsening abdominal pain, persistent vomiting, blood noted in stools, fever, or any other concerns. The patient verbalized understanding.    Final Clinical Impressions(s) / ED Diagnoses   Final diagnoses:  Non-intractable vomiting with nausea, unspecified vomiting type   Patient with symptoms consistent with viral gastroenteritis. Vitals are stable, no fever.  No signs of dehydration, tolerating PO's. Lungs are clear. No focal abdominal pain. Low concern  for appendicitis, cholecystitis, pancreatitis, ruptured viscus, UTI, kidney stone, aortic dissection, aortic aneurysm or other emergent abdominal etiology. Supportive therapy indicated with return if symptoms worsen. Patient counseled.   New Prescriptions New Prescriptions   ONDANSETRON (ZOFRAN ODT) 4 MG DISINTEGRATING TABLET    Take 1 tablet (4 mg total) by mouth every 8 (eight) hours as needed for nausea or vomiting.   [name] personally performed the services described in this documentation, which was scribed in my presence. The recorded information has been reviewed and is accurate.    Renne Crigler, PA-C 09/18/16 0981    Paula Libra, MD 09/18/16 [phone]

## 2016-09-18 NOTE — Discharge Instructions (Signed)
Please read and follow all provided instructions.  Your diagnoses today include:  1. Non-intractable vomiting with nausea, unspecified vomiting type     Tests performed today include:  Blood counts and electrolytes - elevated white blood cell count  Blood tests to check liver and kidney function  Blood tests to check pancreas function  Urine test to look for infection and pregnancy (in women)  Vital signs. See below for your results today.   Medications prescribed:   Zofran (ondansetron) - for nausea and vomiting  Take any prescribed medications only as directed.  Home care instructions:   Follow any educational materials contained in this packet.   Your abdominal pain, nausea, vomiting, and diarrhea may be caused by a viral gastroenteritis also called 'stomach flu'. You should rest for the next several days. Keep drinking plenty of fluids and use the medicine for nausea as directed.    Drink clear liquids for the next 24 hours and introduce solid foods slowly after 24 hours using the b.r.a.t. diet (Bananas, Rice, Applesauce, Toast, Yogurt).    Follow-up instructions: Please follow-up with your primary care provider in the next 2 days for further evaluation of your symptoms. If you are not feeling better in 48 hours you may have a condition that is more serious and you need re-evaluation.   Return instructions:  SEEK IMMEDIATE MEDICAL ATTENTION IF:  If you have pain that does not go away or becomes severe   A temperature above 101F develops   Repeated vomiting occurs (multiple episodes)   If you have pain that becomes localized to portions of the abdomen. The right side could possibly be appendicitis. In an adult, the left lower portion of the abdomen could be colitis or diverticulitis.   Blood is being passed in stools or vomit (bright red or black tarry stools)   You develop chest pain, difficulty breathing, dizziness or fainting, or become confused, poorly  responsive, or inconsolable (young children)  If you have any other emergent concerns regarding your health  Additional Information: Abdominal (belly) pain can be caused by many things. Your caregiver performed an examination and possibly ordered blood/urine tests and imaging (CT scan, x-rays, ultrasound). Many cases can be observed and treated at home after initial evaluation in the emergency department. Even though you are being discharged home, abdominal pain can be unpredictable. Therefore, you need a repeated exam if your pain does not resolve, returns, or worsens. Most patients with abdominal pain don't have to be admitted to the hospital or have surgery, but serious problems like appendicitis and gallbladder attacks can start out as nonspecific pain. Many abdominal conditions cannot be diagnosed in one visit, so follow-up evaluations are very important.  Your vital signs today were: BP (!) 107/54 (BP Location: Right Arm)    Pulse 89    Temp 98.4 F (36.9 C) (Oral)    Resp 18    Ht  (1.778 m)    Wt 88.5 kg    SpO2 95%    BMI 27.98 kg/m  If your blood pressure (bp) was elevated above 135/85 this visit, please have this repeated by your doctor within one month. --------------

## 2017-08-11 ENCOUNTER — Encounter (HOSPITAL_BASED_OUTPATIENT_CLINIC_OR_DEPARTMENT_OTHER): Payer: Self-pay

## 2017-08-11 ENCOUNTER — Emergency Department (HOSPITAL_BASED_OUTPATIENT_CLINIC_OR_DEPARTMENT_OTHER)
Admission: EM | Admit: 2017-08-11 | Discharge: 2017-08-11 | Disposition: A | Discharge status: Home / Self Care | Payer: BC Managed Care – PPO | Attending: Emergency Medicine | Admitting: Emergency Medicine

## 2017-08-11 ENCOUNTER — Other Ambulatory Visit: Payer: Self-pay

## 2017-08-11 DIAGNOSIS — Z7289 Other problems related to lifestyle: Secondary | ICD-10-CM

## 2017-08-11 DIAGNOSIS — R112 Nausea with vomiting, unspecified: Secondary | ICD-10-CM | POA: Insufficient documentation

## 2017-08-11 DIAGNOSIS — R6883 Chills (without fever): Secondary | ICD-10-CM | POA: Insufficient documentation

## 2017-08-11 DIAGNOSIS — F1010 Alcohol abuse, uncomplicated: Secondary | ICD-10-CM | POA: Insufficient documentation

## 2017-08-11 DIAGNOSIS — Z789 Other specified health status: Secondary | ICD-10-CM | POA: Insufficient documentation

## 2017-08-11 DIAGNOSIS — R51 Headache: Secondary | ICD-10-CM | POA: Insufficient documentation

## 2017-08-11 MED ORDER — ONDANSETRON HCL 8 MG PO TABS
8.0000 mg | ORAL_TABLET | Freq: Once | ORAL | Status: AC
  Administered 2017-08-11: 8 mg via ORAL
  Filled 2017-08-11: qty 1

## 2017-08-11 MED ORDER — ONDANSETRON 4 MG PO TBDP: 4.0000 mg | ORAL_TABLET | Freq: Three times a day (TID) | ORAL | 0 refills | Status: AC | PRN

## 2017-08-11 NOTE — ED Provider Notes (Signed)
[facility] EMERGENCY DEPARTMENT Provider Note   CSN: [id number] Arrival date & time: 08/11/17  1253     History   Chief Complaint Chief Complaint  Patient presents with  . Emesis    HPI [name] is a 27 y.o. male history of hernia repair is here for evaluation of persistent nausea and nonbilious, nonbloody emesis that began in the middle of the night. States he has been vomiting at least once every hour. States his birthday was yesterday and he had 7 shots of whiskey. He feels he has hung over.  Associated symptoms include chills and headache. He denies chest pain, shortness of breath abdominal pain or back pain. No associated diarrhea or constipation or urinary symptoms. 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 Denies daily alcohol use or other drug use. States he feels better and nausea is improving after Zofran given him at triage. Relative at bedside states patient appears to be at baseline. Denies head trauma or loss of consciousness last night. No known sick contacts.   HPI  Past Medical History:  Diagnosis Date  . Allergy     There are no active problems to display for this patient.   Past Surgical History:  Procedure Laterality Date  . HERNIA REPAIR         Home Medications    Prior to Admission medications   Medication Sig Start Date End Date Taking? Authorizing Provider  ondansetron (ZOFRAN ODT) 4 MG disintegrating tablet Take 1 tablet (4 mg total) by mouth every 8 (eight) hours as needed for nausea or vomiting. 08/11/17   Liberty Handy, PA-C    Family History Family History  Problem Relation Age of Onset  . Stroke Mother   . Cancer Mother     Social History Social History   Tobacco Use  . Smoking status: Never Smoker  . Smokeless tobacco: Never Used  Substance Use Topics  . Alcohol use: Yes    Comment: occ  . Drug use: No     Allergies   Morphine and related   Review of Systems Review of Systems    Constitutional: Positive for chills.  Gastrointestinal: Positive for nausea and vomiting.  Neurological: Positive for headaches.  All other systems reviewed and are negative.    Physical Exam Updated Vital Signs BP 119/81 (BP Location: Right Wrist)   Pulse 80   Temp 98.4 F (36.9 C) (Oral)   Resp 20   Ht 5\' 9"  (1.753 m)   Wt 93 kg (205 lb)   SpO2 98%   BMI 30.27 kg/m   Physical Exam  Constitutional: He is oriented to person, place, and time. He appears well-developed and well-nourished. No distress.  Non toxic. Laughing with relative at bedside. States "i'm hung over".   HENT:  Head: Normocephalic and atraumatic.  Nose: Nose normal.  Mouth/Throat: No oropharyngeal exudate.  Moist mucous membranes   Eyes: Conjunctivae and EOM are normal. Pupils are equal, round, and reactive to light.  Neck: Normal range of motion.  Cardiovascular: Normal rate, regular rhythm, normal heart sounds and intact distal pulses.  No murmur heard. 2+ DP and radial pulses bilaterally. No LE edema.   Pulmonary/Chest: Effort normal and breath sounds normal.  Abdominal: Soft. Bowel sounds are normal. There is no tenderness.  No G/R/R. No suprapubic or CVA tenderness.   Musculoskeletal: Normal range of motion. He exhibits no deformity.  Neurological: He is alert and oriented to person, place, and  time.  Moves all four extremities in symmetric and controlled fashion. Able to stand up from bed and ambulate to door with steady gait.   Skin: Skin is warm and dry. Capillary refill takes less than 2 seconds.  Psychiatric: He has a normal mood and affect. His behavior is normal. Judgment and thought content normal.  Nursing note and vitals reviewed.    ED Treatments / Results  Labs (all labs ordered are listed, but only abnormal results are displayed) Labs Reviewed - No data to display  EKG  EKG Interpretation None       Radiology No results found.  Procedures Procedures (including critical  care time)  Medications Ordered in ED Medications  ondansetron (ZOFRAN) tablet 8 mg (8 mg Oral Given 08/11/17 1323)     Initial Impression / Assessment and Plan / ED Course  I have reviewed the triage vital signs and the nursing notes.  Pertinent labs & imaging results that were available during my care of the patient were reviewed by me and considered in my medical decision making (see chart for details).    27 year old male here with nausea and vomiting in setting of recent heavy alcohol consumption. On my exam, patient is in no distress and clinically sober. He is holding rational conversation and joking with his relative at bedside. He states his nausea is improving. He has tolerated fluid challenge and ambulatory in the ED. He does not look severely dehydrated. Abdominal exam is benign. History and exam not consistent with intrabdominal or toxic etiology. He denies loss of consciousness or any other trauma last night. No reported or documented history of chronic EtOH use. Symptoms have been controlled in the ED. I don't think further emergent lab worker imaging is indicated today. I discussed with patient and relative at bedside that symptoms are likely from recent alcohol consumption. Discussed symptomatic management at home and typical course to be expected. Discussed return precautions. Patient agreeable.  Final Clinical Impressions(s) / ED Diagnoses   Final diagnoses:  Nausea and vomiting in adult  Alcohol use    ED Discharge Orders        Ordered    ondansetron (ZOFRAN ODT) 4 MG disintegrating tablet  Every 8 hours PRN     08/11/17 1429       Liberty HandyGibbons, Twanna Resh J, PA-C 08/11/17 1446    Raeford RazorKohut, Stephen, MD 08/11/17 1517

## 2017-08-11 NOTE — ED Notes (Signed)
Pt stated that he had 7 shots of [employer] whisky. Yesterday he had a birthday celebration.

## 2017-08-11 NOTE — ED Triage Notes (Signed)
C/o n/v, chills, HA since this am-states he feels is hangover from ETOH last night-NAD-steady gait

## 2017-08-11 NOTE — Discharge Instructions (Signed)
Zofran for nausea. Stay well hydrated. Return for uncontrollable vomiting despite zofran, blood in vomit, abdominal or chest pain.

## 2019-07-09 ENCOUNTER — Other Ambulatory Visit: Payer: Self-pay

## 2019-07-09 DIAGNOSIS — Z20822 Contact with and (suspected) exposure to covid-19: Secondary | ICD-10-CM

## 2019-07-10 LAB — NOVEL CORONAVIRUS, NAA: SARS-CoV-2, NAA: DETECTED — AB

## 2019-07-24 ENCOUNTER — Other Ambulatory Visit: Payer: Self-pay

## 2019-07-24 DIAGNOSIS — Z20822 Contact with and (suspected) exposure to covid-19: Secondary | ICD-10-CM

## 2019-07-25 LAB — NOVEL CORONAVIRUS, NAA: SARS-CoV-2, NAA: NOT DETECTED

## 2019-12-08 ENCOUNTER — Emergency Department (HOSPITAL_BASED_OUTPATIENT_CLINIC_OR_DEPARTMENT_OTHER)
Admission: EM | Admit: 2019-12-08 | Discharge: 2019-12-08 | Disposition: A | Discharge status: Home / Self Care | Payer: Self-pay | Attending: Emergency Medicine | Admitting: Emergency Medicine

## 2019-12-08 ENCOUNTER — Encounter (HOSPITAL_BASED_OUTPATIENT_CLINIC_OR_DEPARTMENT_OTHER): Payer: Self-pay

## 2019-12-08 ENCOUNTER — Emergency Department (HOSPITAL_BASED_OUTPATIENT_CLINIC_OR_DEPARTMENT_OTHER): Payer: Self-pay

## 2019-12-08 ENCOUNTER — Other Ambulatory Visit: Payer: Self-pay

## 2019-12-08 DIAGNOSIS — M25562 Pain in left knee: Secondary | ICD-10-CM | POA: Insufficient documentation

## 2019-12-08 MED ORDER — NAPROXEN 500 MG PO TABS
500.0000 mg | ORAL_TABLET | Freq: Two times a day (BID) | ORAL | 0 refills | Status: DC | PRN
Start: 2019-12-08 — End: 2019-12-27

## 2019-12-08 NOTE — ED Provider Notes (Signed)
[facility] EMERGENCY DEPARTMENT Provider Note   CSN: [id number] Arrival date & time: 12/08/19  1513     History Chief Complaint  Patient presents with   Knee Pain    [name] is a 29 y.o. male without significant past medical history who presents to the emergency department with complaints of left knee pain for the past 2 days.  Patient states the pain had onset as he transitioned from the squatting position to standing as he was playing catcher in a baseball game.  He states that he felt a popping sensation and since then has had discomfort and stiffness to the knee, worse with certain movements, alleviated some with ibuprofen.  Denies numbness, tingling, weakness, redness, or open wounds.  HPI     Past Medical History:  Diagnosis Date   Allergy     There are no problems to display for this patient.   Past Surgical History:  Procedure Laterality Date   HERNIA REPAIR         Family History  Problem Relation Age of Onset   Stroke Mother    Cancer Mother     Social History   Tobacco Use   Smoking status: Never Smoker   Smokeless tobacco: Never Used  Vaping Use   Vaping Use: Never used  Substance Use Topics   Alcohol use: Yes    Comment: occ   Drug use: No    Home Medications Prior to Admission medications   Medication Sig Start Date End Date Taking? Authorizing Provider  ondansetron (ZOFRAN ODT) 4 MG disintegrating tablet Take 1 tablet (4 mg total) by mouth every 8 (eight) hours as needed for nausea or vomiting. 08/11/17   Liberty Handy, PA-C    Allergies    Morphine and related  Review of Systems   Review of Systems  Constitutional: Negative for chills and fever.  Respiratory: Negative for shortness of breath.   Cardiovascular: Negative for chest pain.  Musculoskeletal: Positive for arthralgias.  Skin: Negative for color change and wound.  Neurological: Negative for weakness and numbness.    Physical Exam Updated  Vital Signs BP 126/81 (BP Location: Left Arm)    Pulse 73    Temp 98.8 F (37.1 C) (Oral)    Resp 18    Ht 5\' 9"  (1.753 m)    Wt 104.3 kg    SpO2 99%    BMI 33.97 kg/m   Physical Exam Vitals and nursing note reviewed.  Constitutional:      General: He is not in acute distress.    Appearance: He is not ill-appearing or toxic-appearing.  HENT:     Head: Normocephalic and atraumatic.  Cardiovascular:     Pulses:          Dorsalis pedis pulses are 2+ on the right side and 2+ on the left side.       Posterior tibial pulses are 2+ on the right side and 2+ on the left side.  Pulmonary:     Effort: Pulmonary effort is normal.  Musculoskeletal:     Comments: Lower extremities: No obvious deformity, appreciable swelling, edema, erythema, ecchymosis, warmth, or open wounds. Patient has intact AROM to bilateral hips, knees, ankles, and all digits. Tender to palpation to the left knee medial joint line, otherwise nontender.  No obvious joint instability.  Negative varus/valgus stress test.  Skin:    General: Skin is warm and dry.     Capillary Refill: Capillary refill takes less than 2  seconds.  Neurological:     Mental Status: He is alert.     Comments: Alert. Clear speech. Sensation grossly intact to bilateral lower extremities. 5/5 strength with plantar/dorsiflexion bilaterally. Patient ambulatory  Psychiatric:        Mood and Affect: Mood normal.        Behavior: Behavior normal.     ED Results / Procedures / Treatments   Labs (all labs ordered are listed, but only abnormal results are displayed) Labs Reviewed - No data to display  EKG None  Radiology DG Knee Complete 4 Views Left  Result Date: 12/08/2019 CLINICAL DATA:  Pain, popping. EXAM: LEFT KNEE - COMPLETE 4+ VIEW COMPARISON:  None. FINDINGS: No evidence of fracture, dislocation, or joint effusion. No evidence of arthropathy or other focal bone abnormality. Soft tissues are unremarkable. IMPRESSION: Negative. Electronically  Signed   By: Charlett Nose M.D.   On: 12/08/2019 16:00    Procedures Procedures (including critical care time)  Medications Ordered in ED Medications - No data to display  ED Course  I have reviewed the triage vital signs and the nursing notes.  Pertinent labs & imaging results that were available during my care of the patient were reviewed by me and considered in my medical decision making (see chart for details).    MDM Rules/Calculators/A&P                         Patient presents to the ED with complaints of pain to the  Left knee s/p injury 2 days prior. Exam without obvious deformity or open wounds.  No signs of infection to raise concern for septic joint.  ROM intact. Tender to palpation to the medial joint line. NVI distally. Xray negative for fracture/dislocation- I personally reviewed & interpreted imaging.  Concern for possible meniscal injury.  Knee immobilizer provided, PRICE, prescription for naproxen.. I discussed results, treatment plan, need for follow-up, and return precautions with the patient. Provided opportunity for questions, patient confirmed understanding and are in agreement with plan.   Final Clinical Impression(s) / ED Diagnoses Final diagnoses:  Acute pain of left knee    Rx / DC Orders ED Discharge Orders         Ordered    naproxen (NAPROSYN) 500 MG tablet  2 times daily PRN     Discontinue  Reprint     12/08/19 1631           Cherri Yera, Pleas Koch, PA-C 12/08/19 1632    Little, Ambrose Finland, MD 12/12/19 [phone]

## 2019-12-08 NOTE — Discharge Instructions (Signed)
Please read and follow all provided instructions.  You have been seen today for left knee pain.   Tests performed today include: An x-ray of the affected area - does NOT show any broken bones or dislocations.  Vital signs. See below for your results today.   Home care instructions: -- *PRICE in the first 24-48 hours after injury: Protect (with brace, splint, sling), if given by your provider Rest Ice- Do not apply ice pack directly to your skin, place towel or similar between your skin and ice/ice pack. Apply ice for 20 min, then remove for 40 min while awake Compression- Wear brace, elastic bandage, splint as directed by your provider Elevate affected extremity above the level of your heart when not walking around for the first 24-48 hours   Medications:  - Naproxen is a nonsteroidal anti-inflammatory medication that will help with pain and swelling. Be sure to take this medication as prescribed with food, 1 pill every 12 hours,  It should be taken with food, as it can cause stomach upset, and more seriously, stomach bleeding. Do not take other nonsteroidal anti-inflammatory medications with this such as Advil, Motrin, Aleve, Mobic, Goodie Powder, or Motrin.    You make take Tylenol per over the counter dosing with these medications.   We have prescribed you new medication(s) today. Discuss the medications prescribed today with your pharmacist as they can have adverse effects and interactions with your other medicines including over the counter and prescribed medications. Seek medical evaluation if you start to experience new or abnormal symptoms after taking one of these medicines, seek care immediately if you start to experience difficulty breathing, feeling of your throat closing, facial swelling, or rash as these could be indications of a more serious allergic reaction   Follow-up instructions: Please follow-up with your primary care provider or the provided orthopedic physician (bone  specialist) if you continue to have significant pain in 1 week. In this case you may have a more severe injury that requires further care.   Return instructions:  Please return if your digits or extremity are numb or tingling, appear gray or blue, or you have severe pain (also elevate the extremity and loosen splint or wrap if you were given one) Please return if you have redness or fevers.  Please return to the Emergency Department if you experience worsening symptoms.  Please return if you have any other emergent concerns. Additional Information:  Your vital signs today were: BP 126/81 (BP Location: Left Arm)   Pulse 73   Temp 98.8 F (37.1 C) (Oral)   Resp 18   Ht 5\' 9"  (1.753 m)   Wt 104.3 kg   SpO2 99%   BMI 33.97 kg/m  If your blood pressure (BP) was elevated above 135/85 this visit, please have this repeated by your doctor within one month. ---------------

## 2019-12-08 NOTE — ED Triage Notes (Addendum)
Pt c/o left knee pain/popping x 2 days-denies known injury-started while in catcher position at a ball game-NAD-steady gait

## 2019-12-27 ENCOUNTER — Encounter (HOSPITAL_BASED_OUTPATIENT_CLINIC_OR_DEPARTMENT_OTHER): Payer: Self-pay

## 2019-12-27 ENCOUNTER — Emergency Department (HOSPITAL_BASED_OUTPATIENT_CLINIC_OR_DEPARTMENT_OTHER)
Admission: EM | Admit: 2019-12-27 | Discharge: 2019-12-27 | Disposition: A | Discharge status: Home / Self Care | Payer: Self-pay | Attending: Emergency Medicine | Admitting: Emergency Medicine

## 2019-12-27 ENCOUNTER — Emergency Department (HOSPITAL_BASED_OUTPATIENT_CLINIC_OR_DEPARTMENT_OTHER): Payer: Self-pay

## 2019-12-27 ENCOUNTER — Other Ambulatory Visit: Payer: Self-pay

## 2019-12-27 DIAGNOSIS — M25562 Pain in left knee: Secondary | ICD-10-CM | POA: Insufficient documentation

## 2019-12-27 MED ORDER — NAPROXEN 500 MG PO TABS
500.0000 mg | ORAL_TABLET | Freq: Two times a day (BID) | ORAL | 0 refills | Status: AC
Start: 2019-12-27 — End: ?

## 2019-12-27 NOTE — ED Triage Notes (Signed)
Pt states he injured left knee on 6/17-pt wearing knee immobilizer from recent injury-NAD-steady gait

## 2019-12-27 NOTE — ED Notes (Signed)
ED Provider at bedside. 

## 2019-12-27 NOTE — Discharge Instructions (Signed)

## 2019-12-27 NOTE — ED Provider Notes (Signed)
[facility] EMERGENCY DEPARTMENT Provider Note   CSN: [id number] Arrival date & time: 12/27/19  1230     History Chief Complaint  Patient presents with  . Knee Injury    [name] is a 29 y.o. male.  HPI    29 year old male presenting for evaluation of left knee pain.  States he hurt his knee earlier this month and was seen in the ED and given a knee immobilizer and naproxen.  He took the naproxen and did cold compresses which greatly improved his symptoms however 2 days ago he was walking at a football game when he slipped and hit his left knee on a metal bar.  Denies any other significant injuries but has had intermittent pain since then.  Pain is worse in the morning and at night.  It is also worse with walking.  He currently rates it 8/10.  He has not followed up with orthopedics.  Past Medical History:  Diagnosis Date  . Allergy     There are no problems to display for this patient.   Past Surgical History:  Procedure Laterality Date  . HERNIA REPAIR         Family History  Problem Relation Age of Onset  . Stroke Mother   . Cancer Mother     Social History   Tobacco Use  . Smoking status: Never Smoker  . Smokeless tobacco: Never Used  Vaping Use  . Vaping Use: Never used  Substance Use Topics  . Alcohol use: Yes    Comment: occ  . Drug use: No    Home Medications Prior to Admission medications   Medication Sig Start Date End Date Taking? Authorizing Provider  naproxen (NAPROSYN) 500 MG tablet Take 1 tablet (500 mg total) by mouth 2 (two) times daily. 12/27/19   Rainn Zupko S, PA-C  ondansetron (ZOFRAN ODT) 4 MG disintegrating tablet Take 1 tablet (4 mg total) by mouth every 8 (eight) hours as needed for nausea or vomiting. 08/11/17   Liberty Handy, PA-C    Allergies    Morphine and related  Review of Systems   Review of Systems  Constitutional: Negative for fever.  Musculoskeletal:       Left knee pain  Neurological:  Negative for weakness and numbness.    Physical Exam Updated Vital Signs BP (!) 147/88 (BP Location: Left Arm)   Pulse 74   Temp 98.5 F (36.9 C) (Oral)   Resp 18   Ht 5\' 9"  (1.753 m)   Wt 108 kg   SpO2 98%   BMI 35.15 kg/m   Physical Exam Constitutional:      General: He is not in acute distress.    Appearance: He is well-developed.  Eyes:     Conjunctiva/sclera: Conjunctivae normal.  Cardiovascular:     Rate and Rhythm: Normal rate.  Pulmonary:     Effort: Pulmonary effort is normal.  Musculoskeletal:     Comments: TTP along the medial joint line of the right knee. No erythema, swelling, or joint effusion. FROM of the knee. No joint laxity. No TTP along to tib/fib.   Skin:    General: Skin is warm and dry.  Neurological:     Mental Status: He is alert and oriented to person, place, and time.     ED Results / Procedures / Treatments   Labs (all labs ordered are listed, but only abnormal results are displayed) Labs Reviewed - No data to display  EKG None  Radiology DG Knee Complete 4 Views Left  Result Date: 12/27/2019 CLINICAL DATA:  Injury EXAM: LEFT KNEE - COMPLETE 4+ VIEW COMPARISON:  December 08, 2019 FINDINGS: No acute fracture or dislocation. Joint spaces and alignment are maintained. Enthesophyte at the patellar tendon. No area of erosion or osseous destruction. No unexpected radiopaque foreign body. Soft tissues are unremarkable. IMPRESSION: No acute fracture or dislocation. Electronically Signed   By: Meda Klinefelter MD   On: 12/27/2019 13:25    Procedures Procedures (including critical care time)  Medications Ordered in ED Medications - No data to display  ED Course  I have reviewed the triage vital signs and the nursing notes.  Pertinent labs & imaging results that were available during my care of the patient were reviewed by me and considered in my medical decision making (see chart for details).    MDM Rules/Calculators/A&P                           Patient with knee pain after knee injury 2 days ago.  Knee stable on exam with minimal tenderness along the medial joint line.  X-ray reviewed/interpreted and shows no acute fracture or dislocation.  He has a knee immobilizer from his recent visit.  Advised on wearing this.  Will refill Rx for anti-inflammatories and give information to follow-up with orthopedics.  Final Clinical Impression(s) / ED Diagnoses Final diagnoses:  Acute pain of left knee    Rx / DC Orders ED Discharge Orders         Ordered    naproxen (NAPROSYN) 500 MG tablet  2 times daily     Discontinue  Reprint     12/27/19 1338           Karrie Meres, PA-C 12/27/19 1341    Sabas Sous, MD 12/27/19 [phone]

## 2020-01-06 ENCOUNTER — Telehealth: Payer: Self-pay | Admitting: Family Medicine

## 2020-01-06 NOTE — Telephone Encounter (Signed)
Edrefereal for Lt knee pain-- attempt to contact pt unsuccessful, no VMB--  --glh

## 2020-02-21 ENCOUNTER — Other Ambulatory Visit: Payer: Self-pay

## 2020-02-21 ENCOUNTER — Other Ambulatory Visit: Payer: Self-pay | Admitting: Critical Care Medicine

## 2020-02-21 DIAGNOSIS — Z20822 Contact with and (suspected) exposure to covid-19: Secondary | ICD-10-CM

## 2020-02-23 LAB — SARS-COV-2, NAA 2 DAY TAT

## 2020-02-23 LAB — NOVEL CORONAVIRUS, NAA: SARS-CoV-2, NAA: NOT DETECTED

## 2022-03-01 IMAGING — DX DG KNEE COMPLETE 4+V*L*
5 series · 5 of 5 positions shown · non-contrast
Comparison: December 08, 2019

CLINICAL DATA: Injury

EXAM:
LEFT KNEE - COMPLETE 4+ VIEW

[knee ap]
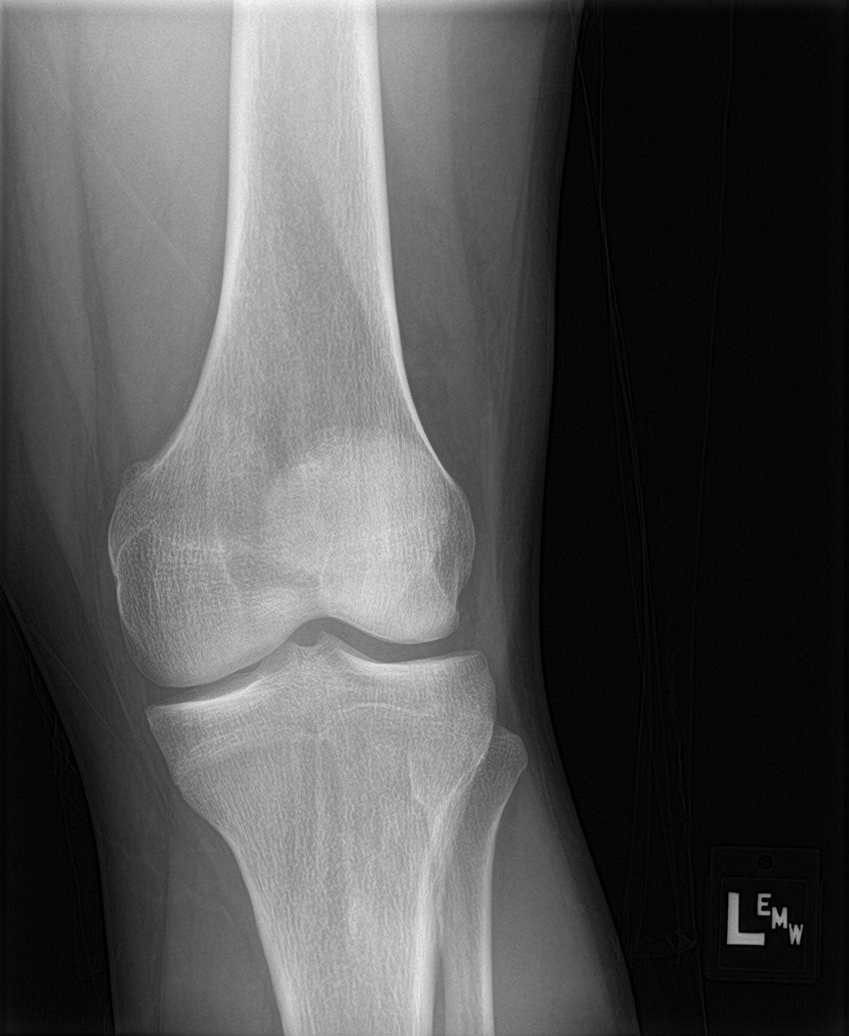

[knee lat (1 of 2)]
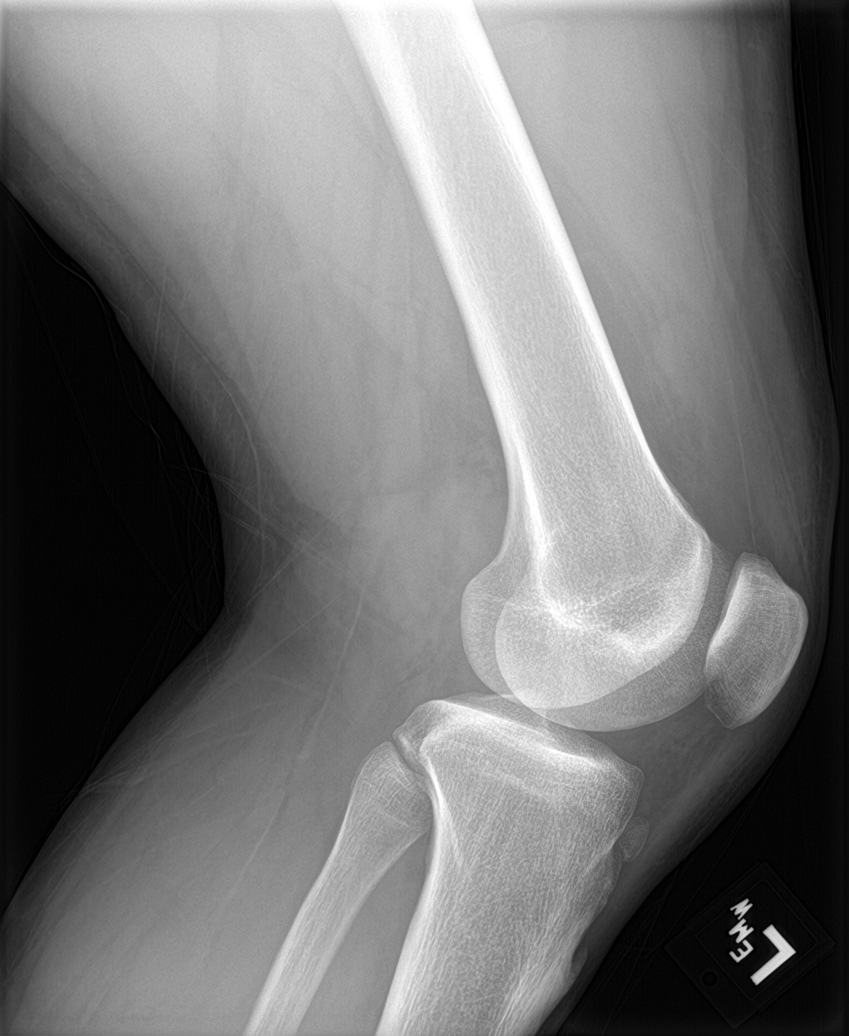

[knee obl (1 of 2)]
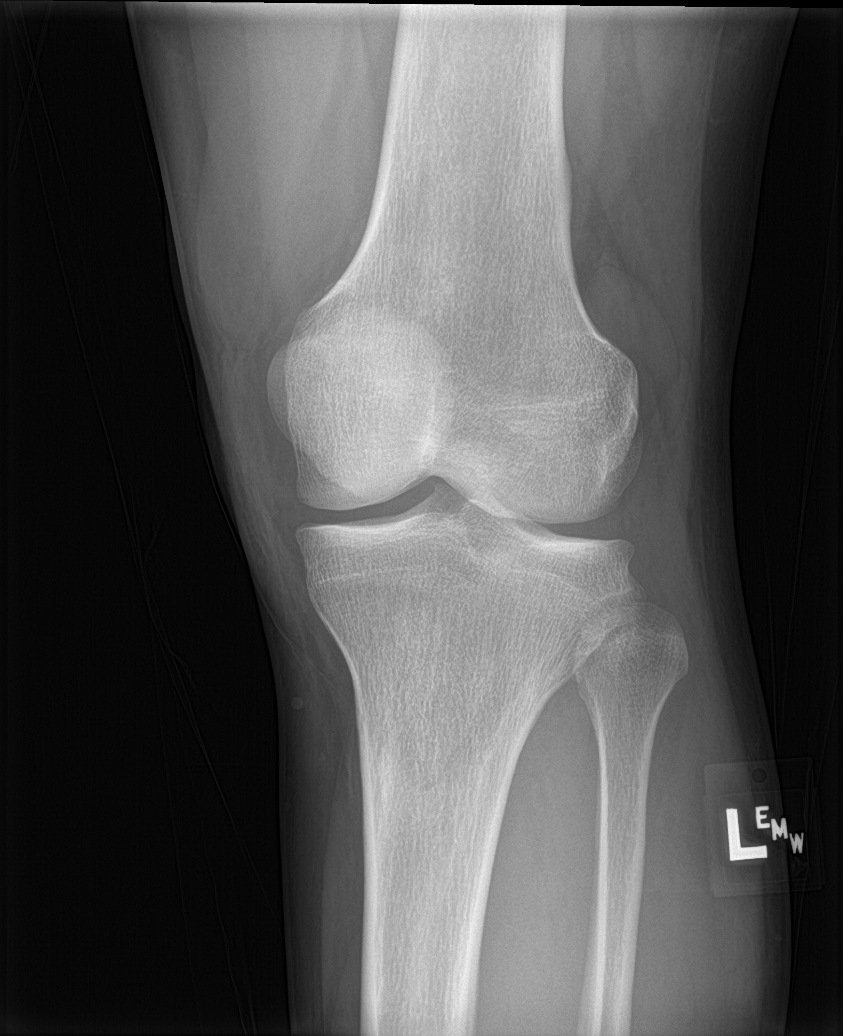

[knee obl (2 of 2)]
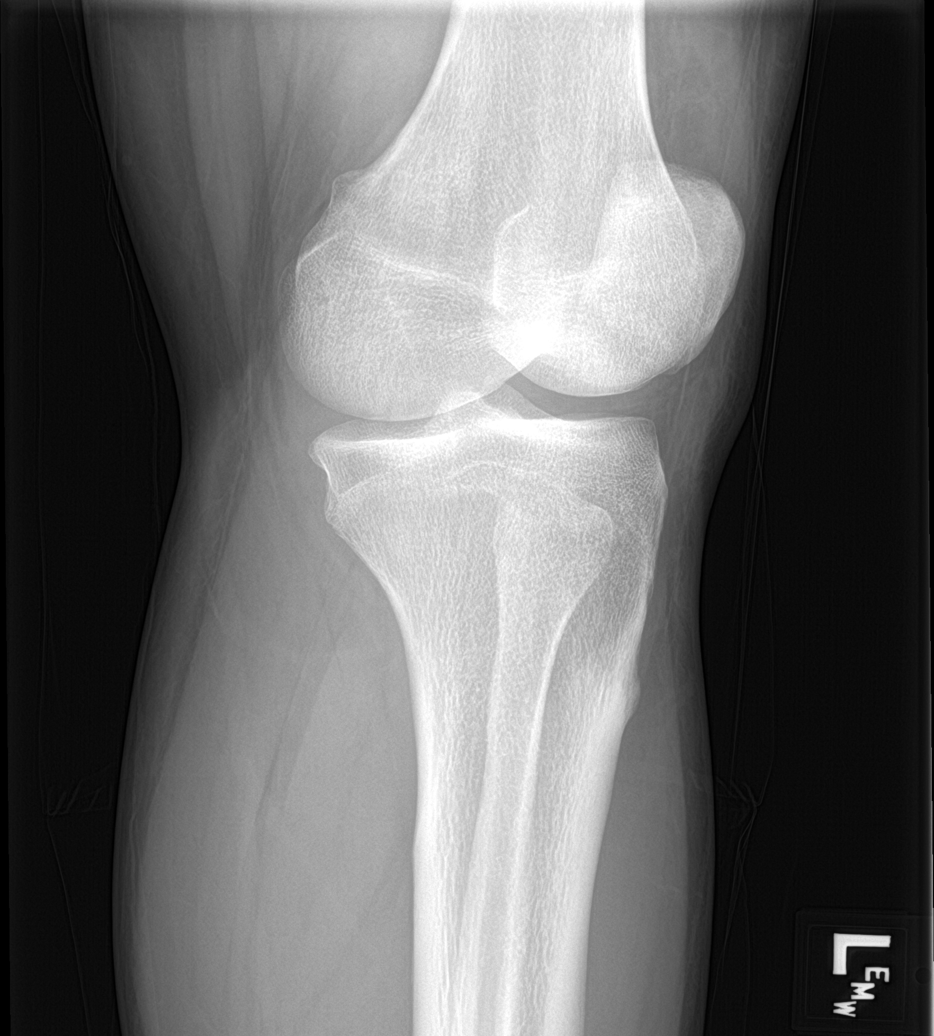

[knee lat (2 of 2)]
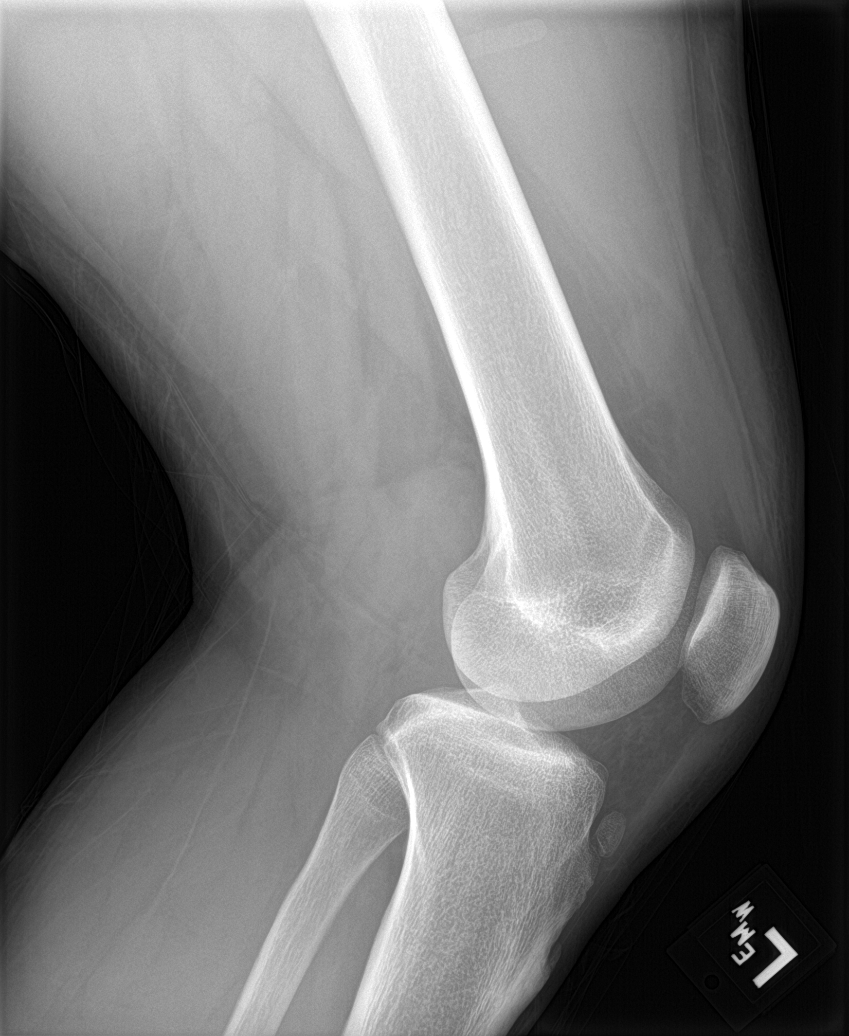

[5 of 5 positions shown; findings below may reference images not displayed]

FINDINGS: No acute fracture or dislocation. Joint spaces and alignment are
maintained. Enthesophyte at the patellar tendon. No area of erosion
or osseous destruction. No unexpected radiopaque foreign body. Soft
tissues are unremarkable.
IMPRESSION: No acute fracture or dislocation.

## 2022-09-22 ENCOUNTER — Encounter: Payer: Self-pay | Admitting: *Deleted

## 2023-12-20 ENCOUNTER — Other Ambulatory Visit: Payer: Self-pay
